# Patient Record
Sex: Female | Born: 1995 | Race: Black or African American | Hispanic: No | Marital: Single | Smoking: Current every day smoker
Health system: Southern US, Community
[De-identification: ages and names within clinical notes are randomized; demographics above are authoritative.]

## PROBLEM LIST (undated history)

## (undated) ENCOUNTER — Inpatient Hospital Stay (HOSPITAL_COMMUNITY): Payer: Medicaid Other

## (undated) ENCOUNTER — Inpatient Hospital Stay (HOSPITAL_COMMUNITY): Payer: Self-pay

## (undated) DIAGNOSIS — L4050 Arthropathic psoriasis, unspecified: Secondary | ICD-10-CM

## (undated) DIAGNOSIS — M1990 Unspecified osteoarthritis, unspecified site: Secondary | ICD-10-CM

## (undated) DIAGNOSIS — G43909 Migraine, unspecified, not intractable, without status migrainosus: Secondary | ICD-10-CM

## (undated) DIAGNOSIS — R51 Headache: Secondary | ICD-10-CM

## (undated) DIAGNOSIS — R519 Headache, unspecified: Secondary | ICD-10-CM

## (undated) DIAGNOSIS — L409 Psoriasis, unspecified: Secondary | ICD-10-CM

## (undated) HISTORY — PX: NO PAST SURGERIES: SHX2092

---
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
## 2015-10-06 NOTE — L&D Delivery Note (Signed)
Patient is 20 y.o. G1P0000 4687w4d admitted for PROM.   Delivery Note At 7:08 PM a viable and healthy female was delivered via Vaginal, Spontaneous Delivery (Presentation: OA ).  APGAR:7 ,8 .  Placenta status: intact.  Cord: 3 vessel  Without complications.  Anesthesia:  epidural Episiotomy: None Lacerations: 1st degree;Perineal; L Labial Suture Repair: 2.0 vicryl Est. Blood Loss (mL): 100  Mom to postpartum.  Baby to Couplet care / Skin to Skin.  Leland HerElsia J Yoo 05/26/2016, 7:34 PM      Upon arrival patient was complete and pushing. She pushed with good maternal effort to deliver a healthy baby girl. Baby delivered without difficulty, was noted to have good tone and place on maternal abdomen for oral suctioning, drying and stimulation. Delayed cord clamping performed. Placenta delivered intact with 3V cord. Vaginal canal and perineum was inspected and repaired; hemostatic. Pitocin was started and uterus massaged until bleeding slowed. Counts of sharps, instruments, and lap pads were all correct.   Leland HerElsia J Yoo, DO PGY-1, [facility] 8/22/20177:34 PM   OB FELLOW DELIVERY ATTESTATION  I was gloved and present for the delivery in its entirety, and I agree with the above resident's note.    Ernestina PennaNicholas Salwa Bai, MD 8:53 PM

## 2015-10-28 ENCOUNTER — Inpatient Hospital Stay (HOSPITAL_COMMUNITY)
Admission: AD | Admit: 2015-10-28 | Discharge: 2015-10-28 | Disposition: A | Discharge status: Home / Self Care | Payer: Medicaid Other | Source: Ambulatory Visit | Attending: Family Medicine | Admitting: Family Medicine

## 2015-10-28 ENCOUNTER — Encounter (HOSPITAL_COMMUNITY): Payer: Self-pay | Admitting: Student

## 2015-10-28 DIAGNOSIS — O26899 Other specified pregnancy related conditions, unspecified trimester: Secondary | ICD-10-CM

## 2015-10-28 DIAGNOSIS — Z3201 Encounter for pregnancy test, result positive: Secondary | ICD-10-CM | POA: Diagnosis not present

## 2015-10-28 DIAGNOSIS — R110 Nausea: Secondary | ICD-10-CM | POA: Diagnosis not present

## 2015-10-28 DIAGNOSIS — N926 Irregular menstruation, unspecified: Secondary | ICD-10-CM | POA: Diagnosis not present

## 2015-10-28 DIAGNOSIS — O26891 Other specified pregnancy related conditions, first trimester: Secondary | ICD-10-CM | POA: Insufficient documentation

## 2015-10-28 DIAGNOSIS — Z3A01 Less than 8 weeks gestation of pregnancy: Secondary | ICD-10-CM | POA: Diagnosis not present

## 2015-10-28 LAB — URINALYSIS, ROUTINE W REFLEX MICROSCOPIC
Bilirubin Urine: NEGATIVE
GLUCOSE, UA: NEGATIVE mg/dL
Hgb urine dipstick: NEGATIVE
KETONES UR: NEGATIVE mg/dL
LEUKOCYTES UA: NEGATIVE
NITRITE: NEGATIVE
PROTEIN: NEGATIVE mg/dL
Specific Gravity, Urine: 1.015 (ref 1.005–1.030)
pH: 7.5 (ref 5.0–8.0)

## 2015-10-28 LAB — POCT PREGNANCY, URINE: PREG TEST UR: POSITIVE — AB

## 2015-10-28 MED ORDER — METOCLOPRAMIDE HCL 10 MG PO TABS: 10.0000 mg | ORAL_TABLET | Freq: Four times a day (QID) | ORAL | Status: DC

## 2015-10-28 MED ORDER — PROMETHAZINE HCL 25 MG PO TABS: 25.0000 mg | ORAL_TABLET | Freq: Four times a day (QID) | ORAL | Status: DC | PRN

## 2015-10-28 NOTE — MAU Provider Note (Signed)
S:  Christine Lambert is a 20 y.o. G1P0 at [redacted]w[redacted]d wks here for confirmation of pregnancy. Patient's last menstrual period was 09/09/2015 (approximate)..  Denies any vaginal bleeding or abdominal pain. Reports daily nausea, requesting medication for nausea. No vomiting or diarrhea. Plans to get prenatal care at undecided.  O:  Past Medical History  Diagnosis Date  . Medical history non-contributory     No family history on file.   Filed Vitals:   10/28/15 1339  BP: 107/58  Pulse: 82  Temp: 98.7 F (37.1 C)  Resp: 16   General:  A&OX3 with no signs of acute distress. She appears well-developed and well-nourished. No distress.  Neck: Normal range of motion.  Pulmonary/Chest: Effort normal. No respiratory distress.  Musculoskeletal: Normal range of motion.  Neurological: She is alert and oriented to person, place, and time.  Skin: Skin is warm and dry.   A: 1. Missed period   2. Positive urine pregnancy test   3. Pregnancy related nausea, antepartum      P: Explained benefits of taking prenatal vitamins w/folic acid early in pregnancy. Begin prenatal care. Reviewed warning signs of pregnancy. Rx phenergan & metoclopramide  Given pregnancy verification letter & list of ob/gyn providers  Judeth Horn, NP

## 2015-10-28 NOTE — MAU Note (Signed)
Took 2 tests last Friday, one was positive, one was negative.  Has been really nauseated

## 2015-10-28 NOTE — Discharge Instructions (Signed)
First Trimester of Pregnancy The first trimester of pregnancy is from week 1 until the end of week 12 (months 1 through 3). A week after a sperm fertilizes an egg, the egg will implant on the wall of the uterus. This embryo will begin to develop into a baby. Genes from you and your partner are forming the baby. The female genes determine whether the baby is a boy or a girl. At 6-8 weeks, the eyes and face are formed, and the heartbeat can be seen on ultrasound. At the end of 12 weeks, all the baby's organs are formed.  Now that you are pregnant, you will want to do everything you can to have a healthy baby. Two of the most important things are to get good prenatal care and to follow your health care provider's instructions. Prenatal care is all the medical care you receive before the baby's birth. This care will help prevent, find, and treat any problems during the pregnancy and childbirth. BODY CHANGES Your body goes through many changes during pregnancy. The changes vary from woman to woman.   You may gain or lose a couple of pounds at first.  You may feel sick to your stomach (nauseous) and throw up (vomit). If the vomiting is uncontrollable, call your health care provider.  You may tire easily.  You may develop headaches that can be relieved by medicines approved by your health care provider.  You may urinate more often. Painful urination may mean you have a bladder infection.  You may develop heartburn as a result of your pregnancy.  You may develop constipation because certain hormones are causing the muscles that push waste through your intestines to slow down.  You may develop hemorrhoids or swollen, bulging veins (varicose veins).  Your breasts may begin to grow larger and become tender. Your nipples may stick out more, and the tissue that surrounds them (areola) may become darker.  Your gums may bleed and may be sensitive to brushing and flossing.  Dark spots or blotches (chloasma,  mask of pregnancy) may develop on your face. This will likely fade after the baby is born.  Your menstrual periods will stop.  You may have a loss of appetite.  You may develop cravings for certain kinds of food.  You may have changes in your emotions from day to day, such as being excited to be pregnant or being concerned that something may go wrong with the pregnancy and baby.  You may have more vivid and strange dreams.  You may have changes in your hair. These can include thickening of your hair, rapid growth, and changes in texture. Some women also have hair loss during or after pregnancy, or hair that feels dry or thin. Your hair will most likely return to normal after your baby is born. WHAT TO EXPECT AT YOUR PRENATAL VISITS During a routine prenatal visit:  You will be weighed to make sure you and the baby are growing normally.  Your blood pressure will be taken.  Your abdomen will be measured to track your baby's growth.  The fetal heartbeat will be listened to starting around week 10 or 12 of your pregnancy.  Test results from any previous visits will be discussed. Your health care provider may ask you:  How you are feeling.  If you are feeling the baby move.  If you have had any abnormal symptoms, such as leaking fluid, bleeding, severe headaches, or abdominal cramping.  If you are using any tobacco products,   including cigarettes, chewing tobacco, and electronic cigarettes.  If you have any questions. Other tests that may be performed during your first trimester include:  Blood tests to find your blood type and to check for the presence of any previous infections. They will also be used to check for low iron levels (anemia) and Rh antibodies. Later in the pregnancy, blood tests for diabetes will be done along with other tests if problems develop.  Urine tests to check for infections, diabetes, or protein in the urine.  An ultrasound to confirm the proper growth  and development of the baby.  An amniocentesis to check for possible genetic problems.  Fetal screens for spina bifida and Down syndrome.  You may need other tests to make sure you and the baby are doing well.  HIV (human immunodeficiency virus) testing. Routine prenatal testing includes screening for HIV, unless you choose not to have this test. HOME CARE INSTRUCTIONS  Medicines  Follow your health care provider's instructions regarding medicine use. Specific medicines may be either safe or unsafe to take during pregnancy.  Take your prenatal vitamins as directed.  If you develop constipation, try taking a stool softener if your health care provider approves. Diet  Eat regular, well-balanced meals. Choose a variety of foods, such as meat or vegetable-based protein, fish, milk and low-fat dairy products, vegetables, fruits, and whole grain breads and cereals. Your health care provider will help you determine the amount of weight gain that is right for you.  Avoid raw meat and uncooked cheese. These carry germs that can cause birth defects in the baby.  Eating four or five small meals rather than three large meals a day may help relieve nausea and vomiting. If you start to feel nauseous, eating a few soda crackers can be helpful. Drinking liquids between meals instead of during meals also seems to help nausea and vomiting.  If you develop constipation, eat more high-fiber foods, such as fresh vegetables or fruit and whole grains. Drink enough fluids to keep your urine clear or pale yellow. Activity and Exercise  Exercise only as directed by your health care provider. Exercising will help you:  Control your weight.  Stay in shape.  Be prepared for labor and delivery.  Experiencing pain or cramping in the lower abdomen or low back is a good sign that you should stop exercising. Check with your health care provider before continuing normal exercises.  Try to avoid standing for long  periods of time. Move your legs often if you must stand in one place for a long time.  Avoid heavy lifting.  Wear low-heeled shoes, and practice good posture.  You may continue to have sex unless your health care provider directs you otherwise. Relief of Pain or Discomfort  Wear a good support bra for breast tenderness.   Take warm sitz baths to soothe any pain or discomfort caused by hemorrhoids. Use hemorrhoid cream if your health care provider approves.   Rest with your legs elevated if you have leg cramps or low back pain.  If you develop varicose veins in your legs, wear support hose. Elevate your feet for 15 minutes, 3-4 times a day. Limit salt in your diet. Prenatal Care  Schedule your prenatal visits by the twelfth week of pregnancy. They are usually scheduled monthly at first, then more often in the last 2 months before delivery.  Write down your questions. Take them to your prenatal visits.  Keep all your prenatal visits as directed by your   health care provider. Safety  Wear your seat belt at all times when driving.  Make a list of emergency phone numbers, including numbers for family, friends, the hospital, and police and fire departments. General Tips  Ask your health care provider for a referral to a local prenatal education class. Begin classes no later than at the beginning of month 6 of your pregnancy.  Ask for help if you have counseling or nutritional needs during pregnancy. Your health care provider can offer advice or refer you to specialists for help with various needs.  Do not use hot tubs, steam rooms, or saunas.  Do not douche or use tampons or scented sanitary pads.  Do not cross your legs for long periods of time.  Avoid cat litter boxes and soil used by cats. These carry germs that can cause birth defects in the baby and possibly loss of the fetus by miscarriage or stillbirth.  Avoid all smoking, herbs, alcohol, and medicines not prescribed by  your health care provider. Chemicals in these affect the formation and growth of the baby.  Do not use any tobacco products, including cigarettes, chewing tobacco, and electronic cigarettes. If you need help quitting, ask your health care provider. You may receive counseling support and other resources to help you quit.  Schedule a dentist appointment. At home, brush your teeth with a soft toothbrush and be gentle when you floss. SEEK MEDICAL CARE IF:   You have dizziness.  You have mild pelvic cramps, pelvic pressure, or nagging pain in the abdominal area.  You have persistent nausea, vomiting, or diarrhea.  You have a bad smelling vaginal discharge.  You have pain with urination.  You notice increased swelling in your face, hands, legs, or ankles. SEEK IMMEDIATE MEDICAL CARE IF:   You have a fever.  You are leaking fluid from your vagina.  You have spotting or bleeding from your vagina.  You have severe abdominal cramping or pain.  You have rapid weight gain or loss.  You vomit blood or material that looks like coffee grounds.  You are exposed to German measles and have never had them.  You are exposed to fifth disease or chickenpox.  You develop a severe headache.  You have shortness of breath.  You have any kind of trauma, such as from a fall or a car accident.   This information is not intended to replace advice given to you by your health care provider. Make sure you discuss any questions you have with your health care provider.   Document Released: 09/15/2001 Document Revised: 10/12/2014 Document Reviewed: 08/01/2013 Elsevier Interactive Patient Education 2016 [employer]     Safe Medications in Pregnancy   Acne: Benzoyl Peroxide Salicylic Acid  Backache/Headache: Tylenol: 2 regular strength every 4 hours OR              2 Extra strength every 6 hours  Colds/Coughs/Allergies: Benadryl (alcohol free) 25 mg every 6 hours as needed Breath right  strips Claritin Cepacol throat lozenges Chloraseptic throat spray Cold-Eeze- up to three times per day Cough drops, alcohol free Flonase (by prescription only) Guaifenesin Mucinex Robitussin DM (plain only, alcohol free) Saline nasal spray/drops Sudafed (pseudoephedrine) & Actifed ** use only after [redacted] weeks gestation and if you do not have high blood pressure Tylenol Vicks Vaporub Zinc lozenges Zyrtec   Constipation: Colace Ducolax suppositories Fleet enema Glycerin suppositories Metamucil Milk of magnesia Miralax Senokot Smooth move tea  Diarrhea: Kaopectate Imodium A-D  *NO pepto Bismol    Hemorrhoids: Anusol Anusol HC Preparation H Tucks  Indigestion: Tums Maalox Mylanta Zantac  Pepcid  Insomnia: Benadryl (alcohol free) 25mg every 6 hours as needed Tylenol PM Unisom, no Gelcaps  Leg Cramps: Tums MagGel  Nausea/Vomiting:  Bonine Dramamine Emetrol Ginger extract Sea bands Meclizine  Nausea medication to take during pregnancy:  Unisom (doxylamine succinate 25 mg tablets) Take one tablet daily at bedtime. If symptoms are not adequately controlled, the dose can be increased to a maximum recommended dose of two tablets daily (1/2 tablet in the morning, 1/2 tablet mid-afternoon and one at bedtime). Vitamin B6 100mg tablets. Take one tablet twice a day (up to 200 mg per day).  Skin Rashes: Aveeno products Benadryl cream or 25mg every 6 hours as needed Calamine Lotion 1% cortisone cream  Yeast infection: Gyne-lotrimin 7 Monistat 7  Gum/tooth pain: Anbesol  **If taking multiple medications, please check labels to avoid duplicating the same active ingredients **take medication as directed on the label ** Do not exceed 4000 mg of tylenol in 24 hours **Do not take medications that contain aspirin or ibuprofen     

## 2015-11-18 ENCOUNTER — Encounter: Payer: Self-pay | Admitting: Obstetrics

## 2015-11-18 ENCOUNTER — Ambulatory Visit (INDEPENDENT_AMBULATORY_CARE_PROVIDER_SITE_OTHER): Payer: Medicaid Other | Admitting: Obstetrics

## 2015-11-18 VITALS — BP 98/62 | HR 76 | Temp 99.3°F | Wt 119.0 lb

## 2015-11-18 DIAGNOSIS — Z3401 Encounter for supervision of normal first pregnancy, first trimester: Secondary | ICD-10-CM

## 2015-11-18 LAB — POCT URINALYSIS DIPSTICK
Bilirubin, UA: NEGATIVE
Glucose, UA: NEGATIVE
KETONES UA: NEGATIVE
Leukocytes, UA: NEGATIVE
Nitrite, UA: NEGATIVE
PH UA: 8
PROTEIN UA: NEGATIVE
RBC UA: NEGATIVE
SPEC GRAV UA: 1.01
Urobilinogen, UA: NEGATIVE

## 2015-11-18 MED ORDER — VITAFOL GUMMIES 3.33-0.333-34.8 MG PO CHEW: 1.0000 | CHEWABLE_TABLET | Freq: Every day | ORAL | Status: DC

## 2015-11-18 NOTE — Progress Notes (Signed)
Subjective:    KINSLY HILD is being seen today for her first obstetrical visit.  This is not a planned pregnancy. She is at [redacted]w[redacted]d gestation. Her obstetrical history is significant for adolescent. Relationship with FOB: significant other, not living together. Patient does intend to breast feed. Pregnancy history fully reviewed.  The information documented in the HPI was reviewed and verified.  Menstrual History: OB History    Gravida Para Term Preterm AB TAB SAB Ectopic Multiple Living   1                Patient's last menstrual period was 09/09/2015 (approximate).    Past Medical History  Diagnosis Date  . Medical history non-contributory     Past Surgical History  Procedure Laterality Date  . No past surgeries       (Not in a hospital admission) No Known Allergies  Social History  Substance Use Topics  . Smoking status: Former Smoker    Quit date: 08/18/2015  . Smokeless tobacco: Never Used  . Alcohol Use: No    History reviewed. No pertinent family history.   Review of Systems Constitutional: negative for weight loss Gastrointestinal: negative for vomiting Genitourinary:negative for genital lesions and vaginal discharge and dysuria Musculoskeletal:negative for back pain Behavioral/Psych: negative for abusive relationship, depression, illegal drug usage and tobacco use    Objective:    BP 98/62 mmHg  Pulse 76  Temp(Src) 99.3 F (37.4 C)  Wt 119 lb (53.978 kg)  LMP 09/09/2015 (Approximate) General Appearance:    Alert, cooperative, no distress, appears stated age  Head:    Normocephalic, without obvious abnormality, atraumatic  Eyes:    PERRL, conjunctiva/corneas clear, EOM's intact, fundi    benign, both eyes  Ears:    Normal TM's and external ear canals, both ears  Nose:   Nares normal, septum midline, mucosa normal, no drainage    or sinus tenderness  Throat:   Lips, mucosa, and tongue normal; teeth and gums normal  Neck:   Supple, symmetrical,  trachea midline, no adenopathy;    thyroid:  no enlargement/tenderness/nodules; no carotid   bruit or JVD  Back:     Symmetric, no curvature, ROM normal, no CVA tenderness  Lungs:     Clear to auscultation bilaterally, respirations unlabored  Chest Wall:    No tenderness or deformity   Heart:    Regular rate and rhythm, S1 and S2 normal, no murmur, rub   or gallop  Breast Exam:    No tenderness, masses, or nipple abnormality  Abdomen:     Soft, non-tender, bowel sounds active all four quadrants,    no masses, no organomegaly  Genitalia:    Normal female without lesion, discharge or tenderness  Extremities:   Extremities normal, atraumatic, no cyanosis or edema  Pulses:   2+ and symmetric all extremities  Skin:   Skin color, texture, turgor normal, no rashes or lesions  Lymph nodes:   Cervical, supraclavicular, and axillary nodes normal  Neurologic:   CNII-XII intact, normal strength, sensation and reflexes    throughout      Lab Review Urine pregnancy test Labs reviewed yes Radiologic studies reviewed yes Assessment:    Pregnancy at [redacted]w[redacted]d weeks    Plan:      Prenatal vitamins.  Counseling provided regarding continued use of seat belts, cessation of alcohol consumption, smoking or use of illicit drugs; infection precautions i.e., influenza/TDAP immunizations, toxoplasmosis,CMV, parvovirus, listeria and varicella; workplace safety, exercise during pregnancy; routine dental care,  safe medications, sexual activity, hot tubs, saunas, pools, travel, caffeine use, fish and methlymercury, potential toxins, hair treatments, varicose veins Weight gain recommendations per IOM guidelines reviewed: underweight/BMI< 18.5--> gain 28 - 40 lbs; normal weight/BMI 18.5 - 24.9--> gain 25 - 35 lbs; overweight/BMI 25 - 29.9--> gain 15 - 25 lbs; obese/BMI >30->gain  11 - 20 lbs Problem list reviewed and updated. FIRST/CF mutation testing/NIPT/QUAD SCREEN/fragile X/Ashkenazi Jewish population  testing/Spinal muscular atrophy discussed: requested. Role of ultrasound in pregnancy discussed; fetal survey: requested. Amniocentesis discussed: not indicated. VBAC calculator score: VBAC consent form provided Meds ordered this encounter  Medications  . Prenatal Multivit-Min-Fe-FA (PRENATAL #2 PO)    Sig: Take by mouth.   Orders Placed This Encounter  Procedures  . SureSwab, Vaginosis/Vaginitis Plus  . Culture, OB Urine  . Obstetric panel  . HIV antibody  . Hemoglobinopathy evaluation  . Varicella zoster antibody, IgG  . VITAMIN D 25 Hydroxy (Vit-D Deficiency, Fractures)  . POCT urinalysis dipstick    Follow up in 4 weeks.

## 2015-11-19 ENCOUNTER — Encounter: Payer: Self-pay | Admitting: Obstetrics

## 2015-11-19 LAB — OBSTETRIC PANEL
Antibody Screen: NEGATIVE
BASOS ABS: 0 10*3/uL (ref 0.0–0.1)
Basophils Relative: 0 % (ref 0–1)
Eosinophils Absolute: 0.3 10*3/uL (ref 0.0–0.7)
Eosinophils Relative: 3 % (ref 0–5)
HCT: 34.9 % — ABNORMAL LOW (ref 36.0–46.0)
HEP B S AG: NEGATIVE
Hemoglobin: 11.4 g/dL — ABNORMAL LOW (ref 12.0–15.0)
LYMPHS ABS: 2.1 10*3/uL (ref 0.7–4.0)
LYMPHS PCT: 25 % (ref 12–46)
MCH: 29.5 pg (ref 26.0–34.0)
MCHC: 32.7 g/dL (ref 30.0–36.0)
MCV: 90.4 fL (ref 78.0–100.0)
MONOS PCT: 7 % (ref 3–12)
MPV: 10.1 fL (ref 8.6–12.4)
Monocytes Absolute: 0.6 10*3/uL (ref 0.1–1.0)
NEUTROS ABS: 5.5 10*3/uL (ref 1.7–7.7)
NEUTROS PCT: 65 % (ref 43–77)
Platelets: 338 10*3/uL (ref 150–400)
RBC: 3.86 MIL/uL — ABNORMAL LOW (ref 3.87–5.11)
RDW: 14.3 % (ref 11.5–15.5)
RUBELLA: 1.59 {index} — AB (ref <0.90)
Rh Type: POSITIVE
WBC: 8.4 10*3/uL (ref 4.0–10.5)

## 2015-11-19 LAB — VITAMIN D 25 HYDROXY (VIT D DEFICIENCY, FRACTURES): VIT D 25 HYDROXY: 19 ng/mL — AB (ref 30–100)

## 2015-11-19 LAB — CULTURE, OB URINE
COLONY COUNT: NO GROWTH
ORGANISM ID, BACTERIA: NO GROWTH

## 2015-11-19 LAB — VARICELLA ZOSTER ANTIBODY, IGG: Varicella IgG: 307.4 Index — ABNORMAL HIGH (ref <135.00)

## 2015-11-19 LAB — HIV ANTIBODY (ROUTINE TESTING W REFLEX): HIV: NONREACTIVE

## 2015-11-20 ENCOUNTER — Ambulatory Visit (INDEPENDENT_AMBULATORY_CARE_PROVIDER_SITE_OTHER): Payer: Medicaid Other

## 2015-11-20 ENCOUNTER — Other Ambulatory Visit: Payer: Medicaid Other

## 2015-11-20 ENCOUNTER — Telehealth: Payer: Self-pay | Admitting: *Deleted

## 2015-11-20 DIAGNOSIS — Z3401 Encounter for supervision of normal first pregnancy, first trimester: Secondary | ICD-10-CM

## 2015-11-20 DIAGNOSIS — O3680X1 Pregnancy with inconclusive fetal viability, fetus 1: Secondary | ICD-10-CM | POA: Diagnosis not present

## 2015-11-20 LAB — HEMOGLOBINOPATHY EVALUATION
HGB A: 97 % (ref 96.8–97.8)
HGB S QUANTITAION: 0 %
Hemoglobin Other: 0 %
Hgb A2 Quant: 3 % (ref 2.2–3.2)
Hgb F Quant: 0 % (ref 0.0–2.0)

## 2015-11-20 NOTE — Telephone Encounter (Signed)
[facility] contacted the office for verification of prenatal vitamin prescription. Verified that it should be 3 gummies daily instead of the one daily that was written.

## 2015-11-22 ENCOUNTER — Other Ambulatory Visit: Payer: Self-pay | Admitting: Certified Nurse Midwife

## 2015-11-22 ENCOUNTER — Other Ambulatory Visit: Payer: Self-pay | Admitting: Obstetrics

## 2015-11-22 DIAGNOSIS — B9689 Other specified bacterial agents as the cause of diseases classified elsewhere: Secondary | ICD-10-CM

## 2015-11-22 DIAGNOSIS — N760 Acute vaginitis: Secondary | ICD-10-CM

## 2015-11-22 DIAGNOSIS — A749 Chlamydial infection, unspecified: Secondary | ICD-10-CM

## 2015-11-22 DIAGNOSIS — B379 Candidiasis, unspecified: Secondary | ICD-10-CM

## 2015-11-22 LAB — SURESWAB, VAGINOSIS/VAGINITIS PLUS
Atopobium vaginae: 7.2 Log (cells/mL)
C. ALBICANS, DNA: DETECTED — AB
C. GLABRATA, DNA: NOT DETECTED
C. PARAPSILOSIS, DNA: NOT DETECTED
C. TRACHOMATIS RNA, TMA: DETECTED — AB
C. TROPICALIS, DNA: NOT DETECTED
Gardnerella vaginalis: >8 Log (cells/mL)
LACTOBACILLUS SPECIES: NOT DETECTED Log (cells/mL)
MEGASPHAERA SPECIES: >8 Log (cells/mL)
N. gonorrhoeae RNA, TMA: NOT DETECTED
T. VAGINALIS RNA, QL TMA: NOT DETECTED

## 2015-11-22 MED ORDER — TERCONAZOLE 0.4 % VA CREA: 1.0000 | TOPICAL_CREAM | Freq: Every day | VAGINAL | Status: DC

## 2015-11-22 MED ORDER — CEFIXIME 400 MG PO TABS: 400.0000 mg | ORAL_TABLET | Freq: Every day | ORAL | Status: DC

## 2015-11-22 MED ORDER — AZITHROMYCIN 250 MG PO TABS: 1000.0000 mg | ORAL_TABLET | Freq: Once | ORAL | Status: DC

## 2015-11-22 MED ORDER — TINIDAZOLE 500 MG PO TABS: 1000.0000 mg | ORAL_TABLET | Freq: Every day | ORAL | Status: DC

## 2015-12-16 ENCOUNTER — Other Ambulatory Visit: Payer: Self-pay | Admitting: Obstetrics

## 2015-12-16 ENCOUNTER — Encounter: Payer: Self-pay | Admitting: Obstetrics

## 2015-12-16 ENCOUNTER — Ambulatory Visit (INDEPENDENT_AMBULATORY_CARE_PROVIDER_SITE_OTHER): Payer: Medicaid Other | Admitting: Obstetrics

## 2015-12-16 DIAGNOSIS — Z3402 Encounter for supervision of normal first pregnancy, second trimester: Secondary | ICD-10-CM

## 2015-12-16 LAB — POCT URINALYSIS DIPSTICK
Bilirubin, UA: NEGATIVE
GLUCOSE UA: NEGATIVE
Ketones, UA: NEGATIVE
LEUKOCYTES UA: NEGATIVE
NITRITE UA: NEGATIVE
Protein, UA: NEGATIVE
RBC UA: NEGATIVE
Spec Grav, UA: 1.02
UROBILINOGEN UA: NEGATIVE
pH, UA: 5

## 2015-12-17 ENCOUNTER — Encounter: Payer: Self-pay | Admitting: Obstetrics

## 2015-12-17 NOTE — Progress Notes (Signed)
  Subjective:    Christine Lambert is a 20 y.o. female being seen today for her obstetrical visit. She is at 5669w4d gestation. Patient reports: no complaints.  Problem List Items Addressed This Visit    None    Visit Diagnoses    Encounter for supervision of normal first pregnancy in second trimester    -  Primary    Relevant Orders    POCT urinalysis dipstick (Completed)    AFP, Quad Screen    US OB Comp + 14 Wk    US OB Transvaginal      There are no active problems to display for this patient.   Objective:     BP 97/65 mmHg  Pulse 81  Temp(Src) 98.3 F (36.8 C)  Wt 122 lb (55.339 kg)  LMP 09/09/2015 (Approximate) Uterine Size: Below umbilicus     Assessment:    Pregnancy @ 2369w4d  weeks Doing well    Plan:    Problem list reviewed and updated. Labs reviewed.  Follow up in 4 weeks. FIRST/CF mutation testing/NIPT/QUAD SCREEN/fragile X/Ashkenazi Jewish population testing/Spinal muscular atrophy discussed: requested. Role of ultrasound in pregnancy discussed; fetal survey: requested. Amniocentesis discussed: not indicated.

## 2015-12-18 LAB — AFP, QUAD SCREEN
DIA MOM VALUE: 0.63
DIA Value (EIA): 127.82 pg/mL
DSR (BY AGE) 1 IN: 1169
DSR (Second Trimester) 1 IN: 10000
Gestational Age: 16.4 WEEKS
MSAFP Mom: 1.17
MSAFP: 51.4 ng/mL
MSHCG MOM: 0.5
MSHCG: 21909 m[IU]/mL
Maternal Age At EDD: 19.6 YEARS
Osb Risk: 10000
PDF: 0
T18 (By Age): 1:4553 {titer}
TEST RESULTS AFP: NEGATIVE
WEIGHT: 122 [lb_av]
uE3 Mom: 0.85
uE3 Value: 0.83 ng/mL

## 2016-01-09 ENCOUNTER — Ambulatory Visit (INDEPENDENT_AMBULATORY_CARE_PROVIDER_SITE_OTHER): Payer: Medicaid Other

## 2016-01-09 ENCOUNTER — Encounter: Payer: Self-pay | Admitting: Obstetrics

## 2016-01-09 ENCOUNTER — Ambulatory Visit (INDEPENDENT_AMBULATORY_CARE_PROVIDER_SITE_OTHER): Payer: Medicaid Other | Admitting: Obstetrics

## 2016-01-09 VITALS — BP 90/60 | HR 72 | Temp 98.6°F | Wt 128.0 lb

## 2016-01-09 DIAGNOSIS — Z3402 Encounter for supervision of normal first pregnancy, second trimester: Secondary | ICD-10-CM

## 2016-01-09 DIAGNOSIS — Z36 Encounter for antenatal screening of mother: Secondary | ICD-10-CM | POA: Diagnosis not present

## 2016-01-09 DIAGNOSIS — G43009 Migraine without aura, not intractable, without status migrainosus: Secondary | ICD-10-CM

## 2016-01-09 DIAGNOSIS — J301 Allergic rhinitis due to pollen: Secondary | ICD-10-CM

## 2016-01-09 LAB — POCT URINALYSIS DIPSTICK
Bilirubin, UA: NEGATIVE
Blood, UA: NEGATIVE
Glucose, UA: NEGATIVE
KETONES UA: NEGATIVE
Leukocytes, UA: NEGATIVE
Nitrite, UA: NEGATIVE
PROTEIN UA: NEGATIVE
SPEC GRAV UA: 1.015
Urobilinogen, UA: NEGATIVE
pH, UA: 8

## 2016-01-09 NOTE — Progress Notes (Signed)
Patient is having migraines that she says is really bad. Feeling pressure but no pain

## 2016-01-09 NOTE — Progress Notes (Signed)
Subjective:    Christine Lambert is a 20 y.o. female being seen today for her obstetrical visit. She is at 925w6d gestation. Patient reports: headache and seasonal rhinitis . Fetal movement: normal.  Problem List Items Addressed This Visit    None    Visit Diagnoses    Encounter for supervision of normal first pregnancy in second trimester    -  Primary    Relevant Orders    POCT urinalysis dipstick (Completed)      There are no active problems to display for this patient.  Objective:    BP 90/60 mmHg  Pulse 72  Temp(Src) 98.6 F (37 C)  Wt 128 lb (58.06 kg)  LMP 09/09/2015 (Approximate) FHT: 150 BPM  Uterine Size: size equals dates     Assessment:    Pregnancy @ 3625w6d     Headache  Seasonal allergies  Plan:   Tylenol recommended  Continue Zyrtec   Signs and symptoms of preterm labor: discussed.  Labs, problem list reviewed and updated 2 hr GTT planned Follow up in 4 weeks.

## 2016-01-13 ENCOUNTER — Encounter: Payer: Medicaid Other | Admitting: Obstetrics

## 2016-01-21 ENCOUNTER — Other Ambulatory Visit: Payer: Self-pay | Admitting: Certified Nurse Midwife

## 2016-02-03 ENCOUNTER — Ambulatory Visit (INDEPENDENT_AMBULATORY_CARE_PROVIDER_SITE_OTHER): Payer: Medicaid Other | Admitting: Obstetrics

## 2016-02-03 VITALS — BP 104/64 | HR 85 | Temp 98.3°F | Wt 140.0 lb

## 2016-02-03 DIAGNOSIS — J301 Allergic rhinitis due to pollen: Secondary | ICD-10-CM

## 2016-02-03 DIAGNOSIS — Z3402 Encounter for supervision of normal first pregnancy, second trimester: Secondary | ICD-10-CM

## 2016-02-03 MED ORDER — CETIRIZINE HCL 10 MG PO CAPS: 1.0000 | ORAL_CAPSULE | Freq: Every day | ORAL | Status: DC

## 2016-02-03 NOTE — Progress Notes (Signed)
Patient has no questions or concerns today.

## 2016-02-10 ENCOUNTER — Encounter: Payer: Medicaid Other | Admitting: Obstetrics

## 2016-02-10 ENCOUNTER — Encounter: Payer: Self-pay | Admitting: Obstetrics

## 2016-02-10 NOTE — Progress Notes (Signed)
Subjective:    Christine Lambert is a 20 y.o. female being seen today for her obstetrical visit. She is at 5476w3d gestation. Patient reports: no complaints . Fetal movement: normal.  Problem List Items Addressed This Visit    None    Visit Diagnoses    Rhinitis due to pollen    -  Primary    Relevant Medications    Cetirizine HCl (ZYRTEC ALLERGY) 10 MG CAPS      There are no active problems to display for this patient.  Objective:    BP 104/64 mmHg  Pulse 85  Temp(Src) 98.3 F (36.8 C)  Wt 140 lb (63.504 kg)  LMP 09/09/2015 (Approximate) FHT: 150 BPM  Uterine Size: size equals dates     Assessment:    Pregnancy @ 7976w3d    Plan:    OBGCT: ordered for next visit. Signs and symptoms of preterm labor: discussed.  Labs, problem list reviewed and updated 2 hr GTT planned Follow up in 4 weeks.

## 2016-03-03 ENCOUNTER — Encounter: Payer: Self-pay | Admitting: Obstetrics

## 2016-03-03 ENCOUNTER — Ambulatory Visit (INDEPENDENT_AMBULATORY_CARE_PROVIDER_SITE_OTHER): Payer: Medicaid Other | Admitting: Obstetrics

## 2016-03-03 ENCOUNTER — Other Ambulatory Visit: Payer: Self-pay | Admitting: Obstetrics

## 2016-03-03 ENCOUNTER — Other Ambulatory Visit: Payer: Medicaid Other

## 2016-03-03 VITALS — BP 94/63 | HR 81 | Temp 98.6°F | Wt 150.0 lb

## 2016-03-03 DIAGNOSIS — Z3402 Encounter for supervision of normal first pregnancy, second trimester: Secondary | ICD-10-CM

## 2016-03-03 DIAGNOSIS — O283 Abnormal ultrasonic finding on antenatal screening of mother: Secondary | ICD-10-CM

## 2016-03-03 LAB — POCT URINALYSIS DIPSTICK
Bilirubin, UA: NEGATIVE
Blood, UA: NEGATIVE
Glucose, UA: NEGATIVE
Ketones, UA: NEGATIVE
LEUKOCYTES UA: NEGATIVE
NITRITE UA: NEGATIVE
PROTEIN UA: NEGATIVE
UROBILINOGEN UA: NEGATIVE
pH, UA: 7.5

## 2016-03-03 NOTE — Progress Notes (Signed)
Subjective:    Christine Lambert is a 20 y.o. female being seen today for her obstetrical visit. She is at 2615w4d gestation. Patient reports: no complaints . Fetal movement: normal.  Problem List Items Addressed This Visit    None    Visit Diagnoses    Encounter for supervision of normal first pregnancy in second trimester    -  Primary    Relevant Orders    POCT urinalysis dipstick (Completed)    Glucose Tolerance, 2 Hours w/1 Hour    CBC    HIV antibody    RPR      There are no active problems to display for this patient.  Objective:    BP 94/63 mmHg  Pulse 81  Temp(Src) 98.6 F (37 C)  Wt 150 lb (68.04 kg)  LMP 09/09/2015 (Approximate) FHT: 150 BPM  Uterine Size: size equals dates     Assessment:    Pregnancy @ 4415w4d    Plan:    OBGCT: ordered.  Labs, problem list reviewed and updated 2 hr GTT planned Follow up in 2 weeks.

## 2016-03-03 NOTE — Progress Notes (Signed)
Patient states she is having irregular contractions- usually at work. Contraction are not painful at this point. Patient does have edema at work.

## 2016-03-04 LAB — CBC
Hematocrit: 35.5 % (ref 34.0–46.6)
Hemoglobin: 11.5 g/dL (ref 11.1–15.9)
MCH: 29.9 pg (ref 26.6–33.0)
MCHC: 32.4 g/dL (ref 31.5–35.7)
MCV: 92 fL (ref 79–97)
PLATELETS: 317 10*3/uL (ref 150–379)
RBC: 3.85 x10E6/uL (ref 3.77–5.28)
RDW: 14.2 % (ref 12.3–15.4)
WBC: 10.9 10*3/uL — AB (ref 3.4–10.8)

## 2016-03-04 LAB — HIV ANTIBODY (ROUTINE TESTING W REFLEX): HIV SCREEN 4TH GENERATION: NONREACTIVE

## 2016-03-04 LAB — GLUCOSE TOLERANCE, 2 HOURS W/ 1HR
GLUCOSE, 1 HOUR: 82 mg/dL (ref 65–179)
GLUCOSE, 2 HOUR: 78 mg/dL (ref 65–152)
GLUCOSE, FASTING: 69 mg/dL (ref 65–91)

## 2016-03-04 LAB — RPR: RPR: NONREACTIVE

## 2016-03-10 ENCOUNTER — Other Ambulatory Visit: Payer: Self-pay | Admitting: Certified Nurse Midwife

## 2016-03-17 ENCOUNTER — Encounter (HOSPITAL_COMMUNITY): Payer: Self-pay

## 2016-03-17 ENCOUNTER — Ambulatory Visit (HOSPITAL_COMMUNITY)
Admission: RE | Admit: 2016-03-17 | Discharge: 2016-03-17 | Disposition: A | Discharge status: Home / Self Care | Payer: Medicaid Other | Source: Ambulatory Visit | Attending: Obstetrics | Admitting: Obstetrics

## 2016-03-17 ENCOUNTER — Other Ambulatory Visit: Payer: Self-pay | Admitting: Obstetrics

## 2016-03-17 ENCOUNTER — Ambulatory Visit (INDEPENDENT_AMBULATORY_CARE_PROVIDER_SITE_OTHER): Payer: Medicaid Other | Admitting: Certified Nurse Midwife

## 2016-03-17 ENCOUNTER — Encounter: Payer: Self-pay | Admitting: *Deleted

## 2016-03-17 VITALS — BP 90/64 | HR 88 | Wt 152.0 lb

## 2016-03-17 DIAGNOSIS — Z1389 Encounter for screening for other disorder: Secondary | ICD-10-CM

## 2016-03-17 DIAGNOSIS — Z3A29 29 weeks gestation of pregnancy: Secondary | ICD-10-CM

## 2016-03-17 DIAGNOSIS — Z3403 Encounter for supervision of normal first pregnancy, third trimester: Secondary | ICD-10-CM

## 2016-03-17 DIAGNOSIS — O359XX0 Maternal care for (suspected) fetal abnormality and damage, unspecified, not applicable or unspecified: Secondary | ICD-10-CM

## 2016-03-17 DIAGNOSIS — O283 Abnormal ultrasonic finding on antenatal screening of mother: Secondary | ICD-10-CM | POA: Diagnosis not present

## 2016-03-17 LAB — POCT URINALYSIS DIPSTICK
BILIRUBIN UA: NEGATIVE
Blood, UA: NEGATIVE
Glucose, UA: NEGATIVE
Ketones, UA: NEGATIVE
NITRITE UA: NEGATIVE
PH UA: 8
Spec Grav, UA: <=1.005
Urobilinogen, UA: NEGATIVE

## 2016-03-17 NOTE — Progress Notes (Signed)
Subjective:    Christine Lambert is a 20 y.o. female being seen today for her obstetrical visit. She is at 7134w4d gestation. Patient reports no complaints. Fetal movement: normal.  Problem List Items Addressed This Visit    None    Visit Diagnoses    Encounter for supervision of normal first pregnancy in third trimester    -  Primary    Relevant Orders    POCT urinalysis dipstick (Completed)      There are no active problems to display for this patient.  Objective:    BP 90/64 mmHg  Pulse 88  Wt 152 lb (68.947 kg)  LMP 09/09/2015 (Approximate) FHT:  140 BPM  Uterine Size: size equals dates  Presentation: cephalic     Assessment:    Pregnancy @ 6334w4d weeks    Plan:     labs reviewed, problem list updated Consent signed. GBS planning TDAP offered  Rhogam given for RH negative Pediatrician: discussed. Infant feeding: plans to breastfeed. Maternity leave: discussed. Cigarette smoking: never smoked. Orders Placed This Encounter  Procedures  . POCT urinalysis dipstick   No orders of the defined types were placed in this encounter.   Follow up in 2 Weeks.

## 2016-03-18 ENCOUNTER — Other Ambulatory Visit (HOSPITAL_COMMUNITY): Payer: Medicaid Other

## 2016-03-31 ENCOUNTER — Ambulatory Visit (INDEPENDENT_AMBULATORY_CARE_PROVIDER_SITE_OTHER): Payer: Medicaid Other | Admitting: Certified Nurse Midwife

## 2016-03-31 VITALS — BP 99/63 | HR 83 | Temp 98.4°F | Wt 156.0 lb

## 2016-03-31 DIAGNOSIS — Z3403 Encounter for supervision of normal first pregnancy, third trimester: Secondary | ICD-10-CM

## 2016-03-31 LAB — POCT URINALYSIS DIPSTICK
BILIRUBIN UA: NEGATIVE
Glucose, UA: 100
KETONES UA: NEGATIVE
LEUKOCYTES UA: NEGATIVE
Nitrite, UA: NEGATIVE
PH UA: 6.5
PROTEIN UA: NEGATIVE
RBC UA: NEGATIVE
SPEC GRAV UA: 1.02
Urobilinogen, UA: NEGATIVE

## 2016-03-31 NOTE — Progress Notes (Signed)
Subjective:    Christine Lambert is a 20 y.o. female being seen today for her obstetrical visit. She is at 8055w4d gestation. Patient reports no bleeding, no contractions, no cramping, no leaking and occasional backache. Fetal movement: normal.  Problem List Items Addressed This Visit    None    Visit Diagnoses    Encounter for supervision of normal first pregnancy in third trimester    -  Primary    Relevant Orders    POCT urinalysis dipstick (Completed)      There are no active problems to display for this patient.  Objective:    BP 99/63 mmHg  Pulse 83  Temp(Src) 98.4 F (36.9 C)  Wt 156 lb (70.761 kg)  LMP 09/09/2015 (Approximate) FHT:  148 BPM  Uterine Size: size equals dates  Presentation: cephalic and breech     Assessment:    Pregnancy @ 7555w4d weeks   Plan:     labs reviewed, problem list updated Consent signed. GBS sent TDAP offered  Rhogam given for RH negative Pediatrician: discussed. Maternity leave: discussed.  Orders Placed This Encounter  Procedures  . POCT urinalysis dipstick   No orders of the defined types were placed in this encounter.   Follow up in 2 Weeks.

## 2016-03-31 NOTE — Progress Notes (Signed)
I agree with note by NP Student Andrew Brake.  Was present for exam.  R.Meggin Ola CNM 

## 2016-04-01 ENCOUNTER — Other Ambulatory Visit: Payer: Self-pay | Admitting: Obstetrics

## 2016-04-14 ENCOUNTER — Ambulatory Visit (INDEPENDENT_AMBULATORY_CARE_PROVIDER_SITE_OTHER): Payer: Medicaid Other | Admitting: Certified Nurse Midwife

## 2016-04-14 VITALS — BP 97/66 | HR 86 | Temp 97.7°F | Wt 160.0 lb

## 2016-04-14 DIAGNOSIS — Z3403 Encounter for supervision of normal first pregnancy, third trimester: Secondary | ICD-10-CM

## 2016-04-14 LAB — POCT URINALYSIS DIPSTICK
Bilirubin, UA: NEGATIVE
Blood, UA: NEGATIVE
Glucose, UA: NEGATIVE
Ketones, UA: NEGATIVE
NITRITE UA: NEGATIVE
PH UA: 6
PROTEIN UA: NEGATIVE
Spec Grav, UA: 1.02
Urobilinogen, UA: NEGATIVE

## 2016-04-14 NOTE — Progress Notes (Signed)
I agree with note by NP Student Andrew Brake.  Was present for exam.  R.Huma Imhoff CNM 

## 2016-04-14 NOTE — Progress Notes (Signed)
Subjective:    Christine Lambert is a 20 y.o. female being seen today for her obstetrical visit. She is at 2721w4d gestation. Patient reports fatigue. Fetal movement: normal.  Problem List Items Addressed This Visit    None    Visit Diagnoses    Encounter for supervision of normal first pregnancy in third trimester    -  Primary    Relevant Orders    POCT urinalysis dipstick (Completed)      There are no active problems to display for this patient.  Objective:    BP 97/66 mmHg  Pulse 86  Temp(Src) 97.7 F (36.5 C)  Wt 160 lb (72.576 kg)  LMP 09/09/2015 (Approximate) FHT:  150 BPM  Uterine Size: size equals dates  Presentation: cephalic     Assessment:    Pregnancy @ 3521w4d weeks   Plan:     labs reviewed, problem list updated Consent signed. GBS sent TDAP offered  Rhogam given for RH negative Pediatrician: discussed. Infant feeding: plans to breastfeed. Maternity leave: discussed. Orders Placed This Encounter  Procedures  . POCT urinalysis dipstick   No orders of the defined types were placed in this encounter.   Follow up in 2 Weeks with GBS.

## 2016-04-28 ENCOUNTER — Ambulatory Visit (INDEPENDENT_AMBULATORY_CARE_PROVIDER_SITE_OTHER): Payer: Medicaid Other | Admitting: Certified Nurse Midwife

## 2016-04-28 VITALS — BP 111/74 | HR 92 | Temp 98.3°F | Wt 167.6 lb

## 2016-04-28 DIAGNOSIS — Z3403 Encounter for supervision of normal first pregnancy, third trimester: Secondary | ICD-10-CM | POA: Insufficient documentation

## 2016-04-28 LAB — POCT URINALYSIS DIPSTICK
Bilirubin, UA: NEGATIVE
GLUCOSE UA: NEGATIVE
Ketones, UA: NEGATIVE
Leukocytes, UA: NEGATIVE
Nitrite, UA: NEGATIVE
PH UA: 6
PROTEIN UA: NEGATIVE
RBC UA: NEGATIVE
SPEC GRAV UA: 1.01
UROBILINOGEN UA: NEGATIVE

## 2016-04-28 LAB — OB RESULTS CONSOLE GC/CHLAMYDIA
CHLAMYDIA, DNA PROBE: NEGATIVE
Gonorrhea: NEGATIVE

## 2016-04-28 LAB — OB RESULTS CONSOLE GBS: STREP GROUP B AG: NEGATIVE

## 2016-04-28 NOTE — Progress Notes (Signed)
Subjective:    Christine Lambert is a 20 y.o. female being seen today for her obstetrical visit. She is at [redacted]w[redacted]d gestation. Patient reports fatigue, no bleeding, no cramping, no leaking and occasional contractions.  Patient reports craving ice for the past couple of weeks Fetal movement: normal.  Problem List Items Addressed This Visit      Other   Supervision of normal first pregnancy in third trimester    Other Visit Diagnoses    Encounter for supervision of normal first pregnancy in third trimester    -  Primary   Relevant Orders   POCT urinalysis dipstick (Completed)   Strep Gp B NAA   NuSwab VG+, Candida 6sp     Patient Active Problem List   Diagnosis Date Noted  . Supervision of normal first pregnancy in third trimester 04/28/2016   Objective:    BP 111/74   Pulse 92   Temp 98.3 F (36.8 C)   Wt 167 lb 9.6 oz (76 kg)   LMP 09/09/2015 (Approximate) Comment: has been irreg since Depo  BMI 30.65 kg/m  FHT:  140 BPM  Uterine Size: size equals dates  Presentation: cephalic     Assessment:    Pregnancy @ [redacted]w[redacted]d weeks   Plan:     labs reviewed, problem list updated Consent signed. GBS sent today. TDAP offered  Rhogam given for RH negative Pediatrician: discussed. Infant feeding: plans to breastfeed. Maternity leave: discussed. Cigarette smoking: quit January 2017. Orders Placed This Encounter  Procedures  . Strep Gp B NAA  . POCT urinalysis dipstick   No orders of the defined types were placed in this encounter. Plans for Depo after delivery. Still deciding on pediatrician. Follow up in 1 Week.

## 2016-04-28 NOTE — Assessment & Plan Note (Signed)
  Clinic  [facility] Prenatal Labs  Dating   Blood type: B/POS/-- (02/13 1408)   Genetic Screen 1 Screen:    AFP:     Quad:     NIPS: Antibody:NEG (02/13 1408)  Anatomic [place]  Rubella: 1.59 (02/13 1408)  GTT Early:               Third trimester:  RPR: Non Reactive (05/30 1130)   Flu vaccine  HBsAg: NEGATIVE (02/13 1408)   TDaP vaccine                                               Rhogam: HIV: Non Reactive (05/30 1130)   Baby Food          Breast                                     GBS: (For PCN allergy, check sensitivities)  Contraception  Depo Pap:  Circumcision N/A   Pediatrician    Support Person

## 2016-04-28 NOTE — Progress Notes (Signed)
Pt is having increase in pelvic pressure, LE swelling and spider viens.

## 2016-04-30 LAB — STREP GP B NAA: Strep Gp B NAA: NEGATIVE

## 2016-05-02 LAB — NUSWAB VG+, CANDIDA 6SP
CANDIDA ALBICANS, NAA: NEGATIVE
CANDIDA GLABRATA, NAA: NEGATIVE
CANDIDA KRUSEI, NAA: NEGATIVE
Candida lusitaniae, NAA: NEGATIVE
Candida parapsilosis, NAA: NEGATIVE
Candida tropicalis, NAA: NEGATIVE
Chlamydia trachomatis, NAA: NEGATIVE
NEISSERIA GONORRHOEAE, NAA: NEGATIVE
TRICH VAG BY NAA: NEGATIVE

## 2016-05-05 ENCOUNTER — Ambulatory Visit (INDEPENDENT_AMBULATORY_CARE_PROVIDER_SITE_OTHER): Payer: Medicaid Other | Admitting: Certified Nurse Midwife

## 2016-05-05 VITALS — BP 106/70 | HR 98 | Temp 99.0°F | Wt 170.0 lb

## 2016-05-05 DIAGNOSIS — Z3403 Encounter for supervision of normal first pregnancy, third trimester: Secondary | ICD-10-CM

## 2016-05-05 DIAGNOSIS — Z3493 Encounter for supervision of normal pregnancy, unspecified, third trimester: Secondary | ICD-10-CM

## 2016-05-05 LAB — POCT URINALYSIS DIPSTICK
Bilirubin, UA: NEGATIVE
Blood, UA: NEGATIVE
GLUCOSE UA: NEGATIVE
Ketones, UA: NEGATIVE
NITRITE UA: NEGATIVE
Protein, UA: NEGATIVE
Spec Grav, UA: 1.01
UROBILINOGEN UA: 0.2
pH, UA: 7

## 2016-05-05 NOTE — Progress Notes (Signed)
Pt states baby movement has decreased since last week.

## 2016-05-05 NOTE — Progress Notes (Signed)
Subjective:    Christine Lambert is a 20 y.o. female being seen today for her obstetrical visit. She is at [redacted]w[redacted]d gestation. Patient reports no complaints. Fetal movement: normal.  Problem List Items Addressed This Visit    None    Visit Diagnoses    Prenatal care, third trimester    -  Primary   Relevant Orders   POCT Urinalysis Dipstick   Encounter for supervision of normal Lambert pregnancy in third trimester         Patient Active Problem List   Diagnosis Date Noted  . Supervision of normal Lambert pregnancy in third trimester 04/28/2016    Objective:    BP 106/70   Pulse 98   Temp 99 F (37.2 C)   Wt 170 lb (77.1 kg)   LMP 09/09/2015 (Approximate) Comment: has been irreg since Depo  BMI 31.09 kg/m  FHT: 145 BPM by doppler + acceleration 15X15 with fetal stimulation  Uterine Size: size equals dates  Presentations: cephalic  Pelvic Exam: deferred     Assessment:    Pregnancy @ [redacted]w[redacted]d weeks    Plan:    Counseled on fetal movement: patient verbalized undestanding Plans for delivery: Vaginal anticipated; labs reviewed; problem list updated Counseling: Consent signed. Infant feeding: plans to breastfeed. Cigarette smoking: never smoked. L&D discussion: symptoms of labor, discussed when to call, discussed what number to call, anesthetic/analgesic options reviewed and delivering clinician:  plans no preference. Postpartum supports and preparation: circumcision discussed and contraception plans discussed.  Follow up in 1 Week.

## 2016-05-07 ENCOUNTER — Encounter (HOSPITAL_COMMUNITY): Payer: Self-pay | Admitting: *Deleted

## 2016-05-07 ENCOUNTER — Other Ambulatory Visit: Payer: Self-pay | Admitting: Obstetrics

## 2016-05-07 ENCOUNTER — Inpatient Hospital Stay (HOSPITAL_COMMUNITY)
Admission: AD | Admit: 2016-05-07 | Discharge: 2016-05-07 | Disposition: A | Discharge status: Home / Self Care | Payer: Medicaid Other | Source: Ambulatory Visit | Attending: Family Medicine | Admitting: Family Medicine

## 2016-05-07 DIAGNOSIS — O9989 Other specified diseases and conditions complicating pregnancy, childbirth and the puerperium: Secondary | ICD-10-CM | POA: Insufficient documentation

## 2016-05-07 DIAGNOSIS — Z3A36 36 weeks gestation of pregnancy: Secondary | ICD-10-CM | POA: Insufficient documentation

## 2016-05-07 DIAGNOSIS — R109 Unspecified abdominal pain: Secondary | ICD-10-CM | POA: Insufficient documentation

## 2016-05-07 NOTE — MAU Note (Signed)
Pain in lower abd started about 30 min ago.  Felt hot.  Has been kind of nauseous since this morning

## 2016-05-07 NOTE — Discharge Instructions (Signed)
Third Trimester of Pregnancy °The third trimester is from week 29 through week 42, months 7 through 9. The third trimester is a time when the fetus is growing rapidly. At the end of the ninth month, the fetus is about 20 inches in length and weighs 6-10 pounds.  °BODY CHANGES °Your body goes through many changes during pregnancy. The changes vary from woman to woman.  °· Your weight will continue to increase. You can expect to gain 25-35 pounds (11-16 kg) by the end of the pregnancy. °· You may begin to get stretch marks on your hips, abdomen, and breasts. °· You may urinate more often because the fetus is moving lower into your pelvis and pressing on your bladder. °· You may develop or continue to have heartburn as a result of your pregnancy. °· You may develop constipation because certain hormones are causing the muscles that push waste through your intestines to slow down. °· You may develop hemorrhoids or swollen, bulging veins (varicose veins). °· You may have pelvic pain because of the weight gain and pregnancy hormones relaxing your joints between the bones in your pelvis. Backaches may result from overexertion of the muscles supporting your posture. °· You may have changes in your hair. These can include thickening of your hair, rapid growth, and changes in texture. Some women also have hair loss during or after pregnancy, or hair that feels dry or thin. Your hair will most likely return to normal after your baby is born. °· Your breasts will continue to grow and be tender. A yellow discharge may leak from your breasts called colostrum. °· Your belly button may stick out. °· You may feel short of breath because of your expanding uterus. °· You may notice the fetus "dropping," or moving lower in your abdomen. °· You may have a bloody mucus discharge. This usually occurs a few days to a week before labor begins. °· Your cervix becomes thin and soft (effaced) near your due date. °WHAT TO EXPECT AT YOUR PRENATAL  EXAMS  °You will have prenatal exams every 2 weeks until week 36. Then, you will have weekly prenatal exams. During a routine prenatal visit: °· You will be weighed to make sure you and the fetus are growing normally. °· Your blood pressure is taken. °· Your abdomen will be measured to track your baby's growth. °· The fetal heartbeat will be listened to. °· Any test results from the previous visit will be discussed. °· You may have a cervical check near your due date to see if you have effaced. °At around 36 weeks, your caregiver will check your cervix. At the same time, your caregiver will also perform a test on the secretions of the vaginal tissue. This test is to determine if a type of bacteria, Group B streptococcus, is present. Your caregiver will explain this further. °Your caregiver may ask you: °· What your birth plan is. °· How you are feeling. °· If you are feeling the baby move. °· If you have had any abnormal symptoms, such as leaking fluid, bleeding, severe headaches, or abdominal cramping. °· If you are using any tobacco products, including cigarettes, chewing tobacco, and electronic cigarettes. °· If you have any questions. °Other tests or screenings that may be performed during your third trimester include: °· Blood tests that check for low iron levels (anemia). °· Fetal testing to check the health, activity level, and growth of the fetus. Testing is done if you have certain medical conditions or if   there are problems during the pregnancy. °· HIV (human immunodeficiency virus) testing. If you are at high risk, you may be screened for HIV during your third trimester of pregnancy. °FALSE LABOR °You may feel small, irregular contractions that eventually go away. These are called Braxton Hicks contractions, or false labor. Contractions may last for hours, days, or even weeks before true labor sets in. If contractions come at regular intervals, intensify, or become painful, it is best to be seen by your  caregiver.  °SIGNS OF LABOR  °· Menstrual-like cramps. °· Contractions that are 5 minutes apart or less. °· Contractions that start on the top of the uterus and spread down to the lower abdomen and back. °· A sense of increased pelvic pressure or back pain. °· A watery or bloody mucus discharge that comes from the vagina. °If you have any of these signs before the 37th week of pregnancy, call your caregiver right away. You need to go to the hospital to get checked immediately. °HOME CARE INSTRUCTIONS  °· Avoid all smoking, herbs, alcohol, and unprescribed drugs. These chemicals affect the formation and growth of the baby. °· Do not use any tobacco products, including cigarettes, chewing tobacco, and electronic cigarettes. If you need help quitting, ask your health care provider. You may receive counseling support and other resources to help you quit. °· Follow your caregiver's instructions regarding medicine use. There are medicines that are either safe or unsafe to take during pregnancy. °· Exercise only as directed by your caregiver. Experiencing uterine cramps is a good sign to stop exercising. °· Continue to eat regular, healthy meals. °· Wear a good support bra for breast tenderness. °· Do not use hot tubs, steam rooms, or saunas. °· Wear your seat belt at all times when driving. °· Avoid raw meat, uncooked cheese, cat litter boxes, and soil used by cats. These carry germs that can cause birth defects in the baby. °· Take your prenatal vitamins. °· Take 1500-2000 mg of calcium daily starting at the 20th week of pregnancy until you deliver your baby. °· Try taking a stool softener (if your caregiver approves) if you develop constipation. Eat more high-fiber foods, such as fresh vegetables or fruit and whole grains. Drink plenty of fluids to keep your urine clear or pale yellow. °· Take warm sitz baths to soothe any pain or discomfort caused by hemorrhoids. Use hemorrhoid cream if your caregiver approves. °· If  you develop varicose veins, wear support hose. Elevate your feet for 15 minutes, 3-4 times a day. Limit salt in your diet. °· Avoid heavy lifting, wear low heal shoes, and practice good posture. °· Rest a lot with your legs elevated if you have leg cramps or low back pain. °· Visit your dentist if you have not gone during your pregnancy. Use a soft toothbrush to brush your teeth and be gentle when you floss. °· A sexual relationship may be continued unless your caregiver directs you otherwise. °· Do not travel far distances unless it is absolutely necessary and only with the approval of your caregiver. °· Take prenatal classes to understand, practice, and ask questions about the labor and delivery. °· Make a trial run to the hospital. °· Pack your hospital bag. °· Prepare the baby's nursery. °· Continue to go to all your prenatal visits as directed by your caregiver. °SEEK MEDICAL CARE IF: °· You are unsure if you are in labor or if your water has broken. °· You have dizziness. °· You have   mild pelvic cramps, pelvic pressure, or nagging pain in your abdominal area. °· You have persistent nausea, vomiting, or diarrhea. °· You have a bad smelling vaginal discharge. °· You have pain with urination. °SEEK IMMEDIATE MEDICAL CARE IF:  °· You have a fever. °· You are leaking fluid from your vagina. °· You have spotting or bleeding from your vagina. °· You have severe abdominal cramping or pain. °· You have rapid weight loss or gain. °· You have shortness of breath with chest pain. °· You notice sudden or extreme swelling of your face, hands, ankles, feet, or legs. °· You have not felt your baby move in over an hour. °· You have severe headaches that do not go away with medicine. °· You have vision changes. °  °This information is not intended to replace advice given to you by your health care provider. Make sure you discuss any questions you have with your health care provider. °  °Document Released: 09/15/2001 Document  Revised: 10/12/2014 Document Reviewed: 11/22/2012 °Elsevier Interactive Patient Education ©2016 [employer] °Fetal Movement Counts °Patient Name: __________________________________________________ Patient Due Date: ____________________ °Performing a fetal movement count is highly recommended in high-risk pregnancies, but it is good for every pregnant woman to do. Your health care provider may ask you to start counting fetal movements at 28 weeks of the pregnancy. Fetal movements often increase: °· After eating a full meal. °· After physical activity. °· After eating or drinking something sweet or cold. °· At rest. °Pay attention to when you feel the baby is most active. This will help you notice a pattern of your baby's sleep and wake cycles and what factors contribute to an increase in fetal movement. It is important to perform a fetal movement count at the same time each day when your baby is normally most active.  °HOW TO COUNT FETAL MOVEMENTS °1. Find a quiet and comfortable area to sit or lie down on your left side. Lying on your left side provides the best blood and oxygen circulation to your baby. °2. Write down the day and time on a sheet of paper or in a journal. °3. Start counting kicks, flutters, swishes, rolls, or jabs in a 2-hour period. You should feel at least 10 movements within 2 hours. °4. If you do not feel 10 movements in 2 hours, wait 2-3 hours and count again. Look for a change in the pattern or not enough counts in 2 hours. °SEEK MEDICAL CARE IF: °· You feel less than 10 counts in 2 hours, tried twice. °· There is no movement in over an hour. °· The pattern is changing or taking longer each day to reach 10 counts in 2 hours. °· You feel the baby is not moving as he or she usually does. °Date: ____________ Movements: ____________ Start time: ____________ Finish time: ____________  °Date: ____________ Movements: ____________ Start time: ____________ Finish time: ____________ °Date:  ____________ Movements: ____________ Start time: ____________ Finish time: ____________ °Date: ____________ Movements: ____________ Start time: ____________ Finish time: ____________ °Date: ____________ Movements: ____________ Start time: ____________ Finish time: ____________ °Date: ____________ Movements: ____________ Start time: ____________ Finish time: ____________ °Date: ____________ Movements: ____________ Start time: ____________ Finish time: ____________ °Date: ____________ Movements: ____________ Start time: ____________ Finish time: ____________  °Date: ____________ Movements: ____________ Start time: ____________ Finish time: ____________ °Date: ____________ Movements: ____________ Start time: ____________ Finish time: ____________ °Date: ____________ Movements: ____________ Start time: ____________ Finish time: ____________ °Date: ____________ Movements: ____________ Start time: ____________ Finish time: ____________ °Date:   ____________ Movements: ____________ Start time: ____________ Doreatha Martin time: ____________ Date: ____________ Movements: ____________ Start time: ____________ Doreatha Martin time: ____________ Date: ____________ Movements: ____________ Start time: ____________ Doreatha Martin time: ____________  Date: ____________ Movements: ____________ Start time: ____________ Doreatha Martin time: ____________ Date: ____________ Movements: ____________ Start time: ____________ Doreatha Martin time: ____________ Date: ____________ Movements: ____________ Start time: ____________ Doreatha Martin time: ____________ Date: ____________ Movements: ____________ Start time: ____________ Doreatha Martin time: ____________ Date: ____________ Movements: ____________ Start time: ____________ Doreatha Martin time: ____________ Date: ____________ Movements: ____________ Start time: ____________ Doreatha Martin time: ____________ Date: ____________ Movements: ____________ Start time: ____________ Doreatha Martin time: ____________  Date: ____________ Movements: ____________ Start  time: ____________ Doreatha Martin time: ____________ Date: ____________ Movements: ____________ Start time: ____________ Doreatha Martin time: ____________ Date: ____________ Movements: ____________ Start time: ____________ Doreatha Martin time: ____________ Date: ____________ Movements: ____________ Start time: ____________ Doreatha Martin time: ____________ Date: ____________ Movements: ____________ Start time: ____________ Doreatha Martin time: ____________ Date: ____________ Movements: ____________ Start time: ____________ Doreatha Martin time: ____________ Date: ____________ Movements: ____________ Start time: ____________ Doreatha Martin time: ____________  Date: ____________ Movements: ____________ Start time: ____________ Doreatha Martin time: ____________ Date: ____________ Movements: ____________ Start time: ____________ Doreatha Martin time: ____________ Date: ____________ Movements: ____________ Start time: ____________ Doreatha Martin time: ____________ Date: ____________ Movements: ____________ Start time: ____________ Doreatha Martin time: ____________ Date: ____________ Movements: ____________ Start time: ____________ Doreatha Martin time: ____________ Date: ____________ Movements: ____________ Start time: ____________ Doreatha Martin time: ____________ Date: ____________ Movements: ____________ Start time: ____________ Doreatha Martin time: ____________  Date: ____________ Movements: ____________ Start time: ____________ Doreatha Martin time: ____________ Date: ____________ Movements: ____________ Start time: ____________ Doreatha Martin time: ____________ Date: ____________ Movements: ____________ Start time: ____________ Doreatha Martin time: ____________ Date: ____________ Movements: ____________ Start time: ____________ Doreatha Martin time: ____________ Date: ____________ Movements: ____________ Start time: ____________ Doreatha Martin time: ____________ Date: ____________ Movements: ____________ Start time: ____________ Doreatha Martin time: ____________ Date: ____________ Movements: ____________ Start time: ____________ Doreatha Martin time: ____________    Date: ____________ Movements: ____________ Start time: ____________ Doreatha Martin time: ____________ Date: ____________ Movements: ____________ Start time: ____________ Doreatha Martin time: ____________ Date: ____________ Movements: ____________ Start time: ____________ Doreatha Martin time: ____________ Date: ____________ Movements: ____________ Start time: ____________ Doreatha Martin time: ____________ Date: ____________ Movements: ____________ Start time: ____________ Doreatha Martin time: ____________ Date: ____________ Movements: ____________ Start time: ____________ Doreatha Martin time: ____________ Date: ____________ Movements: ____________ Start time: ____________ Doreatha Martin time: ____________  Date: ____________ Movements: ____________ Start time: ____________ Doreatha Martin time: ____________ Date: ____________ Movements: ____________ Start time: ____________ Doreatha Martin time: ____________ Date: ____________ Movements: ____________ Start time: ____________ Doreatha Martin time: ____________ Date: ____________ Movements: ____________ Start time: ____________ Doreatha Martin time: ____________ Date: ____________ Movements: ____________ Start time: ____________ Doreatha Martin time: ____________ Date: ____________ Movements: ____________ Start time: ____________ Doreatha Martin time: ____________   This information is not intended to replace advice given to you by your health care provider. Make sure you discuss any questions you have with your health care provider.   Document Released: 10/21/2006 Document Revised: 10/12/2014 Document Reviewed: 07/18/2012 Elsevier Interactive Patient Education [employer]

## 2016-05-13 ENCOUNTER — Encounter: Payer: Self-pay | Admitting: Obstetrics

## 2016-05-13 ENCOUNTER — Ambulatory Visit (INDEPENDENT_AMBULATORY_CARE_PROVIDER_SITE_OTHER): Payer: Medicaid Other | Admitting: Obstetrics

## 2016-05-13 VITALS — BP 122/69 | HR 92 | Wt 172.0 lb

## 2016-05-13 DIAGNOSIS — Z3493 Encounter for supervision of normal pregnancy, unspecified, third trimester: Secondary | ICD-10-CM

## 2016-05-13 NOTE — Progress Notes (Signed)
Subjective:    Christine Lambert is a 20 y.o. female being seen today for her obstetrical visit. She is at 7546w5d gestation. Patient reports itching abdominal stretch marks. Fetal movement: normal.  Problem List Items Addressed This Visit    None    Visit Diagnoses   None.    Patient Active Problem List   Diagnosis Date Noted  . Supervision of normal first pregnancy in third trimester 04/28/2016    Objective:    BP 122/69   Pulse 92   Wt 172 lb (78 kg)   LMP 09/09/2015 (Approximate) Comment: has been irreg since Depo  BMI 31.46 kg/m  FHT: 150 BPM  Uterine Size: size equals dates  Presentations: cephalic    Assessment:    Pregnancy @ 8246w5d weeks   Plan:   Plans for delivery: Vaginal anticipated; labs reviewed; problem list updated Counseling: Consent signed. Infant feeding: plans to breastfeed. Cigarette smoking: never smoked. L&D discussion: symptoms of labor, discussed when to call, discussed what number to call, anesthetic/analgesic options reviewed and delivering clinician:  plans no preference. Postpartum supports and preparation: circumcision discussed and contraception plans discussed.  Follow up in 1 Week.

## 2016-05-19 ENCOUNTER — Ambulatory Visit (INDEPENDENT_AMBULATORY_CARE_PROVIDER_SITE_OTHER): Payer: Medicaid Other | Admitting: Certified Nurse Midwife

## 2016-05-19 VITALS — BP 118/77 | HR 85 | Temp 98.6°F | Wt 173.7 lb

## 2016-05-19 DIAGNOSIS — O2686 Pruritic urticarial papules and plaques of pregnancy (PUPPP): Secondary | ICD-10-CM | POA: Diagnosis not present

## 2016-05-19 DIAGNOSIS — Z3403 Encounter for supervision of normal first pregnancy, third trimester: Secondary | ICD-10-CM

## 2016-05-19 LAB — POCT URINALYSIS DIPSTICK
Bilirubin, UA: NEGATIVE
Glucose, UA: 250
KETONES UA: NEGATIVE
NITRITE UA: NEGATIVE
PH UA: 6
Spec Grav, UA: 1.02
UROBILINOGEN UA: 0.2

## 2016-05-19 MED ORDER — HYDROXYZINE HCL 10 MG PO TABS: 25.0000 mg | ORAL_TABLET | Freq: Three times a day (TID) | ORAL | Status: DC | PRN

## 2016-05-19 NOTE — Progress Notes (Signed)
Subjective:    Christine Lambert is a 20 y.o. female being seen today for her obstetrical visit. She is at 4462w4d gestation. Patient reports no bleeding, no contractions, no cramping, no leaking and itching that started on abdomen and moved to hands and feet now on upper thighs as well. Fetal movement: normal.  Problem List Items Addressed This Visit    None    Visit Diagnoses    Encounter for supervision of normal first pregnancy in third trimester    -  Primary   Relevant Orders   POCT Urinalysis Dipstick (Completed)   PUPP (pruritic urticarial papules and plaques of pregnancy)       Relevant Medications   hydrOXYzine (ATARAX/VISTARIL) tablet 25 mg   Other Relevant Orders   CMP14+CBC/D/Plt+TSH   Hepatic function panel     Patient Active Problem List   Diagnosis Date Noted  . Supervision of normal first pregnancy in third trimester 04/28/2016    Objective:    BP 118/77   Pulse 85   Temp 98.6 F (37 C)   Wt 173 lb 11.2 oz (78.8 kg)   LMP 09/09/2015 (Approximate) Comment: has been irreg since Depo  BMI 31.77 kg/m  FHT: 135 BPM  Uterine Size: 40 cm and size equals dates  Presentations: cephalic  Pelvic Exam: deferred     Assessment:    Pregnancy @ 3562w4d weeks   R/O PUPP Plan:      Labs today for PUPP Plans for delivery: Vaginal anticipated; labs reviewed; problem list updated Counseling: Consent signed. Infant feeding: plans to breastfeed. Cigarette smoking: never smoked. L&D discussion: symptoms of labor, discussed when to call, discussed what number to call, anesthetic/analgesic options reviewed and delivering clinician:  plans no preference. Postpartum supports and preparation: circumcision discussed and contraception plans discussed.  Follow up in 1 Week.

## 2016-05-19 NOTE — Progress Notes (Signed)
Pt c/o rash staring on abdomen 2 weeks ago, has worsened, now spread to thighs, legs, arms, and bottom.  She states rash itches, Benadryl cream helps some with itching, not a lot

## 2016-05-20 LAB — HEPATIC FUNCTION PANEL: BILIRUBIN, DIRECT: 0.05 mg/dL (ref 0.00–0.40)

## 2016-05-20 LAB — CMP14+CBC/D/PLT+TSH
ALBUMIN: 3.6 g/dL (ref 3.5–5.5)
ALT: 18 IU/L (ref 0–32)
AST: 14 IU/L (ref 0–40)
Albumin/Globulin Ratio: 1.5 (ref 1.2–2.2)
Alkaline Phosphatase: 125 IU/L — ABNORMAL HIGH (ref 39–117)
BASOS: 0 %
BUN / CREAT RATIO: 13 (ref 9–23)
BUN: 7 mg/dL (ref 6–20)
Basophils Absolute: 0 10*3/uL (ref 0.0–0.2)
CHLORIDE: 103 mmol/L (ref 96–106)
CO2: 22 mmol/L (ref 18–29)
CREATININE: 0.56 mg/dL — AB (ref 0.57–1.00)
Calcium: 8.9 mg/dL (ref 8.7–10.2)
EOS (ABSOLUTE): 0.3 10*3/uL (ref 0.0–0.4)
EOS: 2 %
GFR, EST AFRICAN AMERICAN: 156 mL/min/{1.73_m2} (ref >59)
GFR, EST NON AFRICAN AMERICAN: 136 mL/min/{1.73_m2} (ref >59)
GLOBULIN, TOTAL: 2.4 g/dL (ref 1.5–4.5)
GLUCOSE: 67 mg/dL (ref 65–99)
HEMATOCRIT: 36.5 % (ref 34.0–46.6)
HEMOGLOBIN: 11.6 g/dL (ref 11.1–15.9)
IMMATURE GRANULOCYTES: 4 %
Immature Grans (Abs): 0.5 10*3/uL — ABNORMAL HIGH (ref 0.0–0.1)
Lymphocytes Absolute: 2 10*3/uL (ref 0.7–3.1)
Lymphs: 18 %
MCH: 29.1 pg (ref 26.6–33.0)
MCHC: 31.8 g/dL (ref 31.5–35.7)
MCV: 92 fL (ref 79–97)
MONOCYTES: 11 %
Monocytes Absolute: 1.2 10*3/uL — ABNORMAL HIGH (ref 0.1–0.9)
NEUTROS PCT: 65 %
Neutrophils Absolute: 7 10*3/uL (ref 1.4–7.0)
Platelets: 332 10*3/uL (ref 150–379)
Potassium: 4 mmol/L (ref 3.5–5.2)
RBC: 3.98 x10E6/uL (ref 3.77–5.28)
RDW: 14.5 % (ref 12.3–15.4)
SODIUM: 141 mmol/L (ref 134–144)
TOTAL PROTEIN: 6 g/dL (ref 6.0–8.5)
TSH: 1.22 u[IU]/mL (ref 0.450–4.500)
WBC: 10.9 10*3/uL — ABNORMAL HIGH (ref 3.4–10.8)

## 2016-05-25 ENCOUNTER — Inpatient Hospital Stay (HOSPITAL_COMMUNITY): Payer: Medicaid Other | Admitting: Anesthesiology

## 2016-05-25 ENCOUNTER — Encounter (HOSPITAL_COMMUNITY): Payer: Self-pay

## 2016-05-25 ENCOUNTER — Inpatient Hospital Stay (HOSPITAL_COMMUNITY)
Admission: AD | Admit: 2016-05-25 | Discharge: 2016-05-28 | DRG: 775 | Disposition: A | Discharge status: Home / Self Care | Payer: Medicaid Other | Source: Ambulatory Visit | Attending: Family Medicine | Admitting: Family Medicine

## 2016-05-25 DIAGNOSIS — Z3A39 39 weeks gestation of pregnancy: Secondary | ICD-10-CM

## 2016-05-25 DIAGNOSIS — O9081 Anemia of the puerperium: Secondary | ICD-10-CM | POA: Diagnosis not present

## 2016-05-25 DIAGNOSIS — IMO0001 Reserved for inherently not codable concepts without codable children: Secondary | ICD-10-CM

## 2016-05-25 DIAGNOSIS — D649 Anemia, unspecified: Secondary | ICD-10-CM | POA: Diagnosis not present

## 2016-05-25 DIAGNOSIS — O4202 Full-term premature rupture of membranes, onset of labor within 24 hours of rupture: Secondary | ICD-10-CM | POA: Diagnosis present

## 2016-05-25 DIAGNOSIS — O4290 Premature rupture of membranes, unspecified as to length of time between rupture and onset of labor, unspecified weeks of gestation: Secondary | ICD-10-CM | POA: Diagnosis present

## 2016-05-25 DIAGNOSIS — Z87891 Personal history of nicotine dependence: Secondary | ICD-10-CM

## 2016-05-25 HISTORY — DX: Unspecified osteoarthritis, unspecified site: M19.90

## 2016-05-25 HISTORY — DX: Headache: R51

## 2016-05-25 HISTORY — DX: Headache, unspecified: R51.9

## 2016-05-25 LAB — TYPE AND SCREEN
ABO/RH(D): B POS
ANTIBODY SCREEN: NEGATIVE

## 2016-05-25 LAB — CBC
HCT: 36.4 % (ref 36.0–46.0)
Hemoglobin: 12.4 g/dL (ref 12.0–15.0)
MCH: 29.9 pg (ref 26.0–34.0)
MCHC: 34.1 g/dL (ref 30.0–36.0)
MCV: 87.7 fL (ref 78.0–100.0)
PLATELETS: 325 10*3/uL (ref 150–400)
RBC: 4.15 MIL/uL (ref 3.87–5.11)
RDW: 14.7 % (ref 11.5–15.5)
WBC: 14.8 10*3/uL — AB (ref 4.0–10.5)

## 2016-05-25 LAB — ABO/RH: ABO/RH(D): B POS

## 2016-05-25 LAB — POCT FERN TEST: POCT Fern Test: POSITIVE

## 2016-05-25 MED ORDER — OXYTOCIN BOLUS FROM INFUSION
500.0000 mL | Freq: Once | INTRAVENOUS | Status: AC
  Administered 2016-05-26: 500 mL via INTRAVENOUS

## 2016-05-25 MED ORDER — OXYCODONE-ACETAMINOPHEN 5-325 MG PO TABS: 1.0000 | ORAL_TABLET | ORAL | Status: DC | PRN

## 2016-05-25 MED ORDER — OXYCODONE-ACETAMINOPHEN 5-325 MG PO TABS: 2.0000 | ORAL_TABLET | ORAL | Status: DC | PRN

## 2016-05-25 MED ORDER — SOD CITRATE-CITRIC ACID 500-334 MG/5ML PO SOLN: 30.0000 mL | ORAL | Status: DC | PRN

## 2016-05-25 MED ORDER — LACTATED RINGERS IV SOLN
INTRAVENOUS | Status: DC
  Administered 2016-05-26 (×2): via INTRAVENOUS

## 2016-05-25 MED ORDER — DIPHENHYDRAMINE HCL 50 MG/ML IJ SOLN: 12.5000 mg | INTRAMUSCULAR | Status: DC | PRN

## 2016-05-25 MED ORDER — ACETAMINOPHEN 325 MG PO TABS: 650.0000 mg | ORAL_TABLET | ORAL | Status: DC | PRN

## 2016-05-25 MED ORDER — TERBUTALINE SULFATE 1 MG/ML IJ SOLN
0.2500 mg | Freq: Once | INTRAMUSCULAR | Status: DC | PRN
  Filled 2016-05-25: qty 1

## 2016-05-25 MED ORDER — SODIUM BICARBONATE 8.4 % IV SOLN
INTRAVENOUS | Status: DC | PRN
  Administered 2016-05-25: 6 mL via EPIDURAL

## 2016-05-25 MED ORDER — EPHEDRINE 5 MG/ML INJ
10.0000 mg | INTRAVENOUS | Status: DC | PRN
  Filled 2016-05-25: qty 4

## 2016-05-25 MED ORDER — ONDANSETRON HCL 4 MG/2ML IJ SOLN: 4.0000 mg | Freq: Four times a day (QID) | INTRAMUSCULAR | Status: DC | PRN

## 2016-05-25 MED ORDER — FENTANYL CITRATE (PF) 100 MCG/2ML IJ SOLN
100.0000 ug | INTRAMUSCULAR | Status: DC | PRN
  Administered 2016-05-25: 100 ug via INTRAVENOUS

## 2016-05-25 MED ORDER — LIDOCAINE HCL (PF) 1 % IJ SOLN
30.0000 mL | INTRAMUSCULAR | Status: DC | PRN
  Administered 2016-05-26: 30 mL via SUBCUTANEOUS
  Filled 2016-05-25: qty 30

## 2016-05-25 MED ORDER — FENTANYL 2.5 MCG/ML BUPIVACAINE 1/10 % EPIDURAL INFUSION (WH - ANES)
14.0000 mL/h | INTRAMUSCULAR | Status: DC | PRN
Start: 2016-05-25 — End: 2016-05-26
  Administered 2016-05-25 – 2016-05-26 (×3): 14 mL/h via EPIDURAL
  Filled 2016-05-25 (×3): qty 125

## 2016-05-25 MED ORDER — LACTATED RINGERS IV SOLN: 500.0000 mL | Freq: Once | INTRAVENOUS | Status: DC

## 2016-05-25 MED ORDER — PHENYLEPHRINE 40 MCG/ML (10ML) SYRINGE FOR IV PUSH (FOR BLOOD PRESSURE SUPPORT)
80.0000 ug | PREFILLED_SYRINGE | INTRAVENOUS | Status: DC | PRN
  Filled 2016-05-25: qty 5

## 2016-05-25 MED ORDER — FENTANYL CITRATE (PF) 100 MCG/2ML IJ SOLN
INTRAMUSCULAR | Status: AC
  Filled 2016-05-25: qty 2

## 2016-05-25 MED ORDER — OXYTOCIN 40 UNITS IN LACTATED RINGERS INFUSION - SIMPLE MED
1.0000 m[IU]/min | INTRAVENOUS | Status: DC
  Administered 2016-05-25: 2 m[IU]/min via INTRAVENOUS

## 2016-05-25 MED ORDER — PHENYLEPHRINE 40 MCG/ML (10ML) SYRINGE FOR IV PUSH (FOR BLOOD PRESSURE SUPPORT)
80.0000 ug | PREFILLED_SYRINGE | INTRAVENOUS | Status: DC | PRN
  Filled 2016-05-25: qty 10
  Filled 2016-05-25: qty 5

## 2016-05-25 MED ORDER — LACTATED RINGERS IV SOLN: 500.0000 mL | INTRAVENOUS | Status: DC | PRN

## 2016-05-25 MED ORDER — OXYTOCIN 40 UNITS IN LACTATED RINGERS INFUSION - SIMPLE MED
2.5000 [IU]/h | INTRAVENOUS | Status: DC
  Administered 2016-05-26: 2.5 [IU]/h via INTRAVENOUS
  Filled 2016-05-25: qty 1000

## 2016-05-25 MED ORDER — FLEET ENEMA 7-19 GM/118ML RE ENEM: 1.0000 | ENEMA | RECTAL | Status: DC | PRN

## 2016-05-25 NOTE — Anesthesia Pain Management Evaluation Note (Signed)
  [facility] Pain Management Visit Note  Patient: Christine Lambert, 20 y.o., female  "Hello I am a member of the anesthesia team at [facility]. We have an anesthesia team available at all times to provide care throughout the hospital, including epidural management and anesthesia for C-section. I don't know your plan for the delivery whether it a natural birth, water birth, IV sedation, nitrous supplementation, doula or epidural, but we want to meet your pain goals."   1.Was your pain managed to your expectations on prior hospitalizations? yes    2.What is your expectation for pain management during this hospitalization?   nitrous    3.How can we help you reach that goal? Nursing intervention  Record the patient's initial score and the patient's pain goal.   Pain: 6  Pain Goal: 2 [facility] wants you to be able to say your pain was always managed very well.  Christine Lambert, Christine Lambert 05/25/2016

## 2016-05-25 NOTE — MAU Note (Signed)
Having lower abdominal pain.  Possible ROM, underwear has felt damp.

## 2016-05-25 NOTE — MAU Note (Signed)
Checked for pooling, Positive ferning.  Called Resident for admission orders.

## 2016-05-25 NOTE — H&P (Signed)
LABOR AND DELIVERY ADMISSION HISTORY AND PHYSICAL NOTE  Christine Lambert is a 20 y.o. female G1P0000 with IUP at 1010w3d by 19.6 wk U/S presenting for PROM. States noticed leaking of clear fluid around midnight last night. Is only feeling irregular contractions that are mild.   She reports positive fetal movement. She denies vaginal bleeding.  Prenatal History/Complications: PUPP  Past Medical History: Past Medical History:  Diagnosis Date  . 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 Headache    migraines    Past Surgical History: Past Surgical History:  Procedure Laterality Date  . NO PAST SURGERIES      Obstetrical History: OB History    Gravida Para Term Preterm AB Living   1 0 0 0 0 0   SAB TAB Ectopic Multiple Live Births   0 0 0 0 0      Social History: Social History   Social History  . Marital status: Single    Spouse name: N/A  . Number of children: N/A  . Years of education: N/A   Social History Main Topics  . Smoking status: Former Smoker    Quit date: 08/18/2015  . Smokeless tobacco: Never Used  . Alcohol use No  . Drug use: No  . Sexual activity: Not Currently    Birth control/ protection: None   Other Topics Concern  . None   Social History Narrative  . None    Family History: History reviewed. No pertinent family history.  Allergies: No Known Allergies  Facility-Administered Medications Prior to Admission  Medication Dose Route Frequency Provider Last Rate Last Dose  . hydrOXYzine (ATARAX/VISTARIL) tablet 25 mg  25 mg Oral TID PRN Roe Coombsachelle A Denney, CNM       Prescriptions Prior to Admission  Medication Sig Dispense Refill Last Dose  . Cetirizine HCl (ZYRTEC ALLERGY) 10 MG CAPS Take 1 capsule (10 mg total) by mouth daily. (Patient taking differently: Take 1 capsule by mouth at bedtime as needed (for allergies). ) 30 capsule 11 Past Week at Unknown time  . diphenhydrAMINE-zinc acetate (BENADRYL) cream Apply 1  application topically 3 (three) times daily as needed for itching.   05/24/2016 at Unknown time  . Prenatal Vit-Fe Phos-FA-Omega (VITAFOL GUMMIES) 3.33-0.333-34.8 MG CHEW CHEW THREE GUMMIES BY MOUTH ONCE DAILY BEFORE BREAKFAST 90 tablet 0 05/25/2016 at Unknown time     Review of Systems   All systems reviewed and negative except as stated in HPI  Blood pressure 108/79, pulse 84, temperature 98.9 F (37.2 C), temperature source Oral, resp. rate 20, height 5\' 2"  (1.575 m), weight 78.5 kg (173 lb), last menstrual period 09/09/2015. General appearance: alert, cooperative and no distress Lungs: no respiratory distress Heart: regular rate Abdomen: soft, non-tender Extremities: No calf swelling or tenderness Presentation: cephalic by ultrasound @1912  Fetal monitoring: 150 baseline, moderate variability, + accelerations, no decelerations Uterine activity: q3-615min Dilation: 3 Effacement (%): 60 Station: -2 Exam by:: Ginnie Smartachel Schmidt RN   Prenatal labs: ABO, Rh: B/POS/-- (02/13 1408) Antibody: NEG (02/13 1408) Rubella: immune RPR: Non Reactive (05/30 1130)  HBsAg: NEGATIVE (02/13 1408)  HIV: Non Reactive (05/30 1130)  GBS: Negative (07/25 1600)  Genetic screening:  negative Anatomy US: L sided choroid plexus cyst  Prenatal Transfer Tool  Maternal Diabetes: No Genetic Screening: Normal Maternal Ultrasounds/Referrals: Abnormal:  Findings:   Isolated choroid plexus cyst on L Fetal Ultrasounds or other Referrals:  None Maternal Substance Abuse:  No Significant Maternal Medications:  None Significant Maternal  Lab Results: Lab values include: Group B Strep negative  Results for orders placed or performed during the hospital encounter of 05/25/16 (from the past 24 hour(s))  Fern Test   Collection Time: 05/25/16  3:24 PM  Result Value Ref Range   POCT Fern Test Positive = ruptured amniotic membanes   CBC   Collection Time: 05/25/16  6:00 PM  Result Value Ref Range   WBC 14.8 (H) 4.0 -  10.5 K/uL   RBC 4.15 3.87 - 5.11 MIL/uL   Hemoglobin 12.4 12.0 - 15.0 g/dL   HCT 16.136.4 09.636.0 - 04.546.0 %   MCV 87.7 78.0 - 100.0 fL   MCH 29.9 26.0 - 34.0 pg   MCHC 34.1 30.0 - 36.0 g/dL   RDW 40.914.7 81.111.5 - 91.415.5 %   Platelets 325 150 - 400 K/uL    Patient Active Problem List   Diagnosis Date Noted  . PROM (premature rupture of membranes) 05/25/2016  . Supervision of normal first pregnancy in third trimester 04/28/2016    Assessment: Christine Lambert is a 20 y.o. G1P0000 at 3992w3d here for PROM.  #Labor:pitocin #Pain: Nitrous oxide #FWB: Category  #ID:  GBS negative #MOF: breast  #MOC:depo #Circ:  Girl, N/A  Leland HerElsia J Yoo, DO PGY-1 8/21/20176:38 PM   Midwife attestation: I have seen and examined this patient; I agree with above documentation in the resident's note.   Christine Lambert is a 20 y.o. G1P1001 @[redacted]w[redacted]d  here for PROM.  PE: BP 112/68 (BP Location: Right Arm)   Pulse 90   Temp 98.1 F (36.7 C) (Oral)   Resp 20   Ht 5\' 2"  (1.575 m)   Wt 173 lb (78.5 kg)   LMP 09/09/2015 (Approximate) Comment: has been irreg since Depo  SpO2 100%   Breastfeeding? Unknown   BMI 31.64 kg/m  Gen: calm comfortable, NAD Resp: normal effort, no distress Abd: gravid  ROS, labs, PMH reviewed  Plan: Admit to LD Labor: Cervical exam done in MAU and pt having irregular contractions/early labor signs.  Start Pitocin 2 milliunits/min, increase by 2 every 30 minutes until adequate. FWB: Category I ID: GBS negative  LEFTWICH-KIRBY, Jaire Pinkham, CNM  05/27/2016, 9:06 AM

## 2016-05-25 NOTE — Anesthesia Preprocedure Evaluation (Signed)
Anesthesia Evaluation  Patient identified by MRN, date of birth, ID band Patient awake    Reviewed: Allergy & Precautions, NPO status , Patient's Chart, lab work & pertinent test results  Airway Mallampati: II  TM Distance: >3 FB Neck ROM: Full    Dental no notable dental hx.    Pulmonary neg pulmonary ROS, former smoker,    Pulmonary exam normal breath sounds clear to auscultation       Cardiovascular negative cardio ROS Normal cardiovascular exam Rhythm:Regular Rate:Normal     Neuro/Psych negative neurological ROS  negative psych ROS   GI/Hepatic negative GI ROS, Neg liver ROS,   Endo/Other  negative endocrine ROS  Renal/GU negative Renal ROS  negative genitourinary   Musculoskeletal negative musculoskeletal ROS (+)   Abdominal   Peds negative pediatric ROS (+)  Hematology negative hematology ROS (+)   Anesthesia Other Findings   Reproductive/Obstetrics negative OB ROS                             Anesthesia Physical Anesthesia Plan  ASA: II  Anesthesia Plan: Epidural   Post-op Pain Management:    Induction:   Airway Management Planned: Natural Airway  Additional Equipment:   Intra-op Plan:   Post-operative Plan:   Informed Consent: I have reviewed the patients History and Physical, chart, labs and discussed the procedure including the risks, benefits and alternatives for the proposed anesthesia with the patient or authorized representative who has indicated his/her understanding and acceptance.     Plan Discussed with: CRNA  Anesthesia Plan Comments: (Informed consent obtained prior to proceeding including risk of failure, 1% risk of PDPH, risk of minor discomfort and bruising.  Discussed rare but serious complications including epidural abscess, permanent nerve injury, epidural hematoma.  Discussed alternatives to epidural analgesia and patient desires to proceed.   Timeout performed pre-procedure verifying patient name, procedure, and platelet count.  Patient tolerated procedure well.)        Anesthesia Quick Evaluation

## 2016-05-26 ENCOUNTER — Encounter (HOSPITAL_COMMUNITY): Payer: Self-pay | Admitting: *Deleted

## 2016-05-26 DIAGNOSIS — Z3A39 39 weeks gestation of pregnancy: Secondary | ICD-10-CM

## 2016-05-26 DIAGNOSIS — Z87891 Personal history of nicotine dependence: Secondary | ICD-10-CM

## 2016-05-26 DIAGNOSIS — IMO0001 Reserved for inherently not codable concepts without codable children: Secondary | ICD-10-CM

## 2016-05-26 DIAGNOSIS — O4202 Full-term premature rupture of membranes, onset of labor within 24 hours of rupture: Secondary | ICD-10-CM

## 2016-05-26 DIAGNOSIS — O9081 Anemia of the puerperium: Secondary | ICD-10-CM

## 2016-05-26 LAB — RPR: RPR Ser Ql: NONREACTIVE

## 2016-05-26 MED ORDER — OXYCODONE-ACETAMINOPHEN 5-325 MG PO TABS
2.0000 | ORAL_TABLET | ORAL | Status: DC | PRN
Start: 2016-05-26 — End: 2016-05-26

## 2016-05-26 MED ORDER — ACETAMINOPHEN 325 MG PO TABS: 650.0000 mg | ORAL_TABLET | ORAL | Status: DC | PRN

## 2016-05-26 MED ORDER — SENNOSIDES-DOCUSATE SODIUM 8.6-50 MG PO TABS
2.0000 | ORAL_TABLET | ORAL | Status: DC
  Administered 2016-05-26 – 2016-05-27 (×2): 2 via ORAL
  Filled 2016-05-26 (×2): qty 2

## 2016-05-26 MED ORDER — SOD CITRATE-CITRIC ACID 500-334 MG/5ML PO SOLN: 30.0000 mL | ORAL | Status: DC | PRN

## 2016-05-26 MED ORDER — BENZOCAINE-MENTHOL 20-0.5 % EX AERO
1.0000 "application " | INHALATION_SPRAY | CUTANEOUS | Status: DC | PRN
  Administered 2016-05-26: 1 via TOPICAL
  Filled 2016-05-26: qty 56

## 2016-05-26 MED ORDER — ZOLPIDEM TARTRATE 5 MG PO TABS: 5.0000 mg | ORAL_TABLET | Freq: Every evening | ORAL | Status: DC | PRN

## 2016-05-26 MED ORDER — OXYTOCIN 40 UNITS IN LACTATED RINGERS INFUSION - SIMPLE MED: 2.5000 [IU]/h | INTRAVENOUS | Status: DC

## 2016-05-26 MED ORDER — WITCH HAZEL-GLYCERIN EX PADS: 1.0000 "application " | MEDICATED_PAD | CUTANEOUS | Status: DC | PRN

## 2016-05-26 MED ORDER — LIDOCAINE HCL (PF) 1 % IJ SOLN
30.0000 mL | INTRAMUSCULAR | Status: DC | PRN
  Filled 2016-05-26: qty 30

## 2016-05-26 MED ORDER — ONDANSETRON HCL 4 MG/2ML IJ SOLN: 4.0000 mg | Freq: Four times a day (QID) | INTRAMUSCULAR | Status: DC | PRN

## 2016-05-26 MED ORDER — LIDOCAINE HCL (PF) 1 % IJ SOLN
INTRAMUSCULAR | Status: DC | PRN
  Administered 2016-05-25 (×2): 6 mL

## 2016-05-26 MED ORDER — LACTATED RINGERS IV SOLN: INTRAVENOUS | Status: DC

## 2016-05-26 MED ORDER — SIMETHICONE 80 MG PO CHEW: 80.0000 mg | CHEWABLE_TABLET | ORAL | Status: DC | PRN

## 2016-05-26 MED ORDER — IBUPROFEN 600 MG PO TABS
600.0000 mg | ORAL_TABLET | Freq: Four times a day (QID) | ORAL | Status: DC
  Administered 2016-05-26 – 2016-05-28 (×9): 600 mg via ORAL
  Filled 2016-05-26 (×8): qty 1

## 2016-05-26 MED ORDER — LACTATED RINGERS IV SOLN: 500.0000 mL | Freq: Once | INTRAVENOUS | Status: DC

## 2016-05-26 MED ORDER — ACETAMINOPHEN 325 MG PO TABS
650.0000 mg | ORAL_TABLET | ORAL | Status: DC | PRN
  Administered 2016-05-26: 650 mg via ORAL
  Filled 2016-05-26: qty 2

## 2016-05-26 MED ORDER — COCONUT OIL OIL: 1.0000 "application " | TOPICAL_OIL | Status: DC | PRN

## 2016-05-26 MED ORDER — ONDANSETRON HCL 4 MG PO TABS: 4.0000 mg | ORAL_TABLET | ORAL | Status: DC | PRN

## 2016-05-26 MED ORDER — PNEUMOCOCCAL VAC POLYVALENT 25 MCG/0.5ML IJ INJ
0.5000 mL | INJECTION | INTRAMUSCULAR | Status: DC
  Filled 2016-05-26: qty 0.5

## 2016-05-26 MED ORDER — LACTATED RINGERS IV SOLN
500.0000 mL | INTRAVENOUS | Status: DC | PRN
Start: 2016-05-26 — End: 2016-05-26

## 2016-05-26 MED ORDER — DIPHENHYDRAMINE HCL 25 MG PO CAPS: 25.0000 mg | ORAL_CAPSULE | Freq: Four times a day (QID) | ORAL | Status: DC | PRN

## 2016-05-26 MED ORDER — PRENATAL MULTIVITAMIN CH
1.0000 | ORAL_TABLET | Freq: Every day | ORAL | Status: DC
  Administered 2016-05-27 – 2016-05-28 (×2): 1 via ORAL
  Filled 2016-05-26 (×2): qty 1

## 2016-05-26 MED ORDER — OXYCODONE-ACETAMINOPHEN 5-325 MG PO TABS: 1.0000 | ORAL_TABLET | ORAL | Status: DC | PRN

## 2016-05-26 MED ORDER — ONDANSETRON HCL 4 MG/2ML IJ SOLN
4.0000 mg | INTRAMUSCULAR | Status: DC | PRN
Start: 2016-05-26 — End: 2016-05-28

## 2016-05-26 MED ORDER — OXYTOCIN BOLUS FROM INFUSION: 500.0000 mL | Freq: Once | INTRAVENOUS | Status: DC

## 2016-05-26 MED ORDER — DIBUCAINE 1 % RE OINT: 1.0000 "application " | TOPICAL_OINTMENT | RECTAL | Status: DC | PRN

## 2016-05-26 MED ORDER — FLEET ENEMA 7-19 GM/118ML RE ENEM: 1.0000 | ENEMA | Freq: Once | RECTAL | Status: DC

## 2016-05-26 MED ORDER — TETANUS-DIPHTH-ACELL PERTUSSIS 5-2.5-18.5 LF-MCG/0.5 IM SUSP
0.5000 mL | Freq: Once | INTRAMUSCULAR | Status: AC
  Administered 2016-05-28: 0.5 mL via INTRAMUSCULAR
  Filled 2016-05-26: qty 0.5

## 2016-05-26 NOTE — Anesthesia Procedure Notes (Signed)
Epidural Patient location during procedure: OB Start time: 05/25/2016 11:00 PM  Staffing Anesthesiologist: Sherrian DiversENENNY, Eilidh Marcano  Preanesthetic Checklist Completed: patient identified, site marked, surgical consent, pre-op evaluation, timeout performed, IV checked, risks and benefits discussed and monitors and equipment checked  Epidural Patient position: sitting Prep: DuraPrep Patient monitoring: blood pressure and heart rate Approach: midline Location: L4-L5 Injection technique: LOR saline  Needle:  Needle type: Tuohy  Needle gauge: 17 G Needle length: 9 cm Needle insertion depth: 5 cm Catheter type: closed end flexible Catheter size: 19 Gauge Catheter at skin depth: 13 cm Test dose: negative and Other  Assessment Events: blood not aspirated, injection not painful, no injection resistance, negative IV test and no paresthesia  Additional Notes Reason for block:procedure for pain

## 2016-05-27 LAB — CBC
HEMATOCRIT: 27.4 % — AB (ref 36.0–46.0)
HEMOGLOBIN: 9.3 g/dL — AB (ref 12.0–15.0)
MCH: 29.1 pg (ref 26.0–34.0)
MCHC: 33.9 g/dL (ref 30.0–36.0)
MCV: 85.6 fL (ref 78.0–100.0)
Platelets: 254 10*3/uL (ref 150–400)
RBC: 3.2 MIL/uL — AB (ref 3.87–5.11)
RDW: 14.8 % (ref 11.5–15.5)
WBC: 17.8 10*3/uL — ABNORMAL HIGH (ref 4.0–10.5)

## 2016-05-27 MED ORDER — OXYCODONE-ACETAMINOPHEN 5-325 MG PO TABS
1.0000 | ORAL_TABLET | ORAL | Status: DC | PRN
  Administered 2016-05-27 – 2016-05-28 (×5): 1 via ORAL
  Filled 2016-05-27 (×5): qty 1

## 2016-05-27 MED ORDER — POLYSACCHARIDE IRON COMPLEX 150 MG PO CAPS
150.0000 mg | ORAL_CAPSULE | Freq: Two times a day (BID) | ORAL | Status: DC
  Administered 2016-05-27 – 2016-05-28 (×2): 150 mg via ORAL
  Filled 2016-05-27 (×2): qty 1

## 2016-05-27 NOTE — Progress Notes (Signed)
UR chart review completed.  

## 2016-05-27 NOTE — Anesthesia Postprocedure Evaluation (Signed)
Anesthesia Post Note  Patient: Christine Lambert  Procedure(s) Performed: * No procedures listed *  Patient location during evaluation: Mother Baby Anesthesia Type: Epidural Level of consciousness: awake and alert Pain management: satisfactory to patient Vital Signs Assessment: post-procedure vital signs reviewed and stable Respiratory status: respiratory function stable Cardiovascular status: stable Postop Assessment: no headache, no backache, epidural receding, patient able to bend at knees, no signs of nausea or vomiting and adequate PO intake Anesthetic complications: no     Last Vitals:  Vitals:   05/26/16 2240 05/27/16 0300  BP: 119/66 112/68  Pulse: (!) 103 90  Resp: 18 20  Temp: 37.6 C 36.7 C    Last Pain:  Vitals:   05/27/16 0815  TempSrc:   PainSc: 9    Pain Goal: Patients Stated Pain Goal: 7 (05/26/16 0756)               Karleen DolphinFUSSELL,Luisfernando Brightwell

## 2016-05-27 NOTE — Lactation Note (Signed)
This note was copied from a baby's chart. Lactation Consultation Note  Baby sleeping STS on mother's chest.  Mother states she has been taught hand expression and view drops. Reviewed pp 22-26 in Baby & Me booklet. Suggest mother Lambert for assistance w/ next feeding to view latch. Mom encouraged to feed baby 8-12 times/24 hours and with feeding cues.  Mom made aware of O/P services, breastfeeding support groups, community resources, and our phone # for post-discharge questions.    Patient Name: Christine Lambert Today's Date: 05/27/2016     Maternal Data    Feeding Feeding Type: Breast Fed  LATCH Score/Interventions Latch: Repeated attempts needed to sustain latch, nipple held in mouth throughout feeding, stimulation needed to elicit sucking reflex. Intervention(s): Adjust position;Assist with latch  Audible Swallowing: A few with stimulation Intervention(s): Skin to skin;Hand expression  Type of Nipple: Flat Intervention(s): Reverse pressure  Comfort (Breast/Nipple): Soft / non-tender     Hold (Positioning): Assistance needed to correctly position infant at breast and maintain latch.  LATCH Score: 6  Lactation Tools Discussed/Used     Consult Status      Dahlia ByesBerkelhammer, Ruth Roosevelt [facility] 05/27/2016, 11:25 AM

## 2016-05-27 NOTE — Progress Notes (Signed)
Post Partum Day #1 Subjective: no complaints, up ad lib, voiding and tolerating PO. Working with breast feeding.    Objective: Blood pressure 112/68, pulse 90, temperature 98.1 F (36.7 C), temperature source Oral, resp. rate 20, height 5\' 2"  (1.575 m), weight 173 lb (78.5 kg), last menstrual period 09/09/2015, SpO2 100 %, unknown if currently breastfeeding.  Physical Exam:  General: alert, cooperative and no distress Lochia: appropriate Uterine Fundus: firm Incision: no significant drainage, no significant erythema DVT Evaluation: No evidence of DVT seen on physical exam. No cords or calf tenderness. No significant calf/ankle edema.   Recent Labs  05/25/16 1800 05/27/16 0512  HGB 12.4 9.3*  HCT 36.4 27.4*    Assessment/Plan: Plan for discharge tomorrow, Breastfeeding, Lactation consult and Contraception planning depo injections.  Anemia: starting Niferex   LOS: 2 days   Roe CoombsRachelle A Doniel Maiello, CNM 05/27/2016, 12:59 PM

## 2016-05-28 ENCOUNTER — Encounter (HOSPITAL_COMMUNITY): Payer: Self-pay | Admitting: *Deleted

## 2016-05-28 MED ORDER — MEDROXYPROGESTERONE ACETATE 150 MG/ML IM SUSP: 150.0000 mg | INTRAMUSCULAR | 4 refills | Status: DC

## 2016-05-28 MED ORDER — IBUPROFEN 600 MG PO TABS: 600.0000 mg | ORAL_TABLET | Freq: Four times a day (QID) | ORAL | 2 refills | Status: DC

## 2016-05-28 MED ORDER — BENZOCAINE-MENTHOL 20-0.5 % EX AERO: 1.0000 "application " | INHALATION_SPRAY | CUTANEOUS | 0 refills | Status: DC | PRN

## 2016-05-28 MED ORDER — SENNOSIDES-DOCUSATE SODIUM 8.6-50 MG PO TABS: 2.0000 | ORAL_TABLET | ORAL | 2 refills | Status: DC

## 2016-05-28 MED ORDER — POLYSACCHARIDE IRON COMPLEX 150 MG PO CAPS: 150.0000 mg | ORAL_CAPSULE | Freq: Two times a day (BID) | ORAL | 4 refills | Status: DC

## 2016-05-28 MED ORDER — COCONUT OIL OIL
1.0000 | TOPICAL_OIL | 99 refills | Status: DC | PRN
Start: 2016-05-28 — End: 2017-10-04

## 2016-05-28 MED ORDER — OXYCODONE-ACETAMINOPHEN 5-325 MG PO TABS: 1.0000 | ORAL_TABLET | ORAL | 0 refills | Status: DC | PRN

## 2016-05-28 NOTE — Lactation Note (Signed)
This note was copied from a baby's chart. Lactation Consultation Note: Mother breastfeeding infant when I arrived in the room for follow up. Infant is latched on the (L) breast without the nipple shield. Infants lip widely gaped. Observed audible swallows and burst of suckling and swallows. Mother states that she used her harmony hand pump to firm her nipple before latching. She states this works well. Suggested to mother to pump for 15 min on each breast after breastfeeding. Mother has an electric pump at home she plans to use. Advised to use pump at least 4 times daily if she is using the nipple shield. Discussed engorgement treatment and care of her breast while engorge. Mother receptive to all teaching.   Patient Name: Christine Lambert ZOXWR'UToday's Date: 05/28/2016 Reason for consult: Follow-up assessment   Maternal Data    Feeding Feeding Type: Breast Fed Length of feed: 10 min  LATCH Score/Interventions Latch: Repeated attempts needed to sustain latch, nipple held in mouth throughout feeding, stimulation needed to elicit sucking reflex.  Audible Swallowing: A few with stimulation  Type of Nipple: Everted at rest and after stimulation (with hand pump) Intervention(s): Hand pump  Comfort (Breast/Nipple): Soft / non-tender     Hold (Positioning): No assistance needed to correctly position infant at breast.  LATCH Score: 8  Lactation Tools Discussed/Used Tools:  (no nipple shield) Nipple shield size: 16   Consult Status Consult Status: Complete    Michel BickersKendrick, Marshea Wisher McCoy 05/28/2016, 9:46 AM

## 2016-05-28 NOTE — Lactation Note (Signed)
This note was copied from a baby's chart. Lactation Consultation Note Mom having difficulty latching baby. Mom has pendulum breast w/tiny nipple. Very compressible. Mom states baby will not latch well, crying and hasn't BF well. RN fitted #16 NS to Lt. Nipple in football hold. Baby BF great having good latch BF w/o pain. Noted Lt. Breast softer than Rt. Breast after BF on Lt. Breast. Unlatched mom has mature milk in NS. Encouraged comfort during BF so colostrum flows better and mom will enjoy the feeding longer. Taking deep breaths and breast massage during BF.  Discussed positioning, latching, and cluster feeding.   Patient Name: [name] JXBJY'NToday's Date: Lambert Reason for consult: Follow-up assessment;Difficult latch   Maternal Data Has patient been taught Hand Expression?: Yes Does the patient have breastfeeding experience prior to this delivery?: No  Feeding Feeding Type: Breast Fed Length of feed: 15 min (still BF)  LATCH Score/Interventions Latch: Repeated attempts needed to sustain latch, nipple held in mouth throughout feeding, stimulation needed to elicit sucking reflex. Intervention(s): Skin to skin;Teach feeding cues;Waking techniques Intervention(s): Adjust position;Assist with latch;Breast massage;Breast compression  Audible Swallowing: Spontaneous and intermittent Intervention(s): Skin to skin;Hand expression;Alternate breast massage  Type of Nipple: Everted at rest and after stimulation (semi flat)  Comfort (Breast/Nipple): Soft / non-tender     Hold (Positioning): Assistance needed to correctly position infant at breast and maintain latch. Intervention(s): Breastfeeding basics reviewed;Support Pillows;Position options;Skin to skin  LATCH Score: 8  Lactation Tools Discussed/Used Tools: Nipple Shields Nipple shield size: 16   Consult Status Consult Status: Follow-up Date: 05/29/16 Follow-up type: In-patient    Charyl DancerCARVER, Christine Lambert,  4:19 AM

## 2016-05-28 NOTE — Discharge Summary (Signed)
Obstetric Discharge Summary Reason for Admission: onset of labor Prenatal Procedures: ultrasound Intrapartum Procedures: spontaneous vaginal delivery Postpartum Procedures: none Complications-Operative and Postpartum: 1ST degree perineal laceration Hemoglobin  Date Value Ref Range Status  05/27/2016 9.3 (L) 12.0 - 15.0 g/dL Final    Comment:    DELTA CHECK NOTED REPEATED TO VERIFY    HCT  Date Value Ref Range Status  05/27/2016 27.4 (L) 36.0 - 46.0 % Final   Hematocrit  Date Value Ref Range Status  05/19/2016 36.5 34.0 - 46.6 % Final    Physical Exam:  General: alert, cooperative and no distress Lochia: appropriate Uterine Fundus: firm Incision: no significant drainage, no dehiscence, no significant erythema DVT Evaluation: No evidence of DVT seen on physical exam. No cords or calf tenderness. No significant calf/ankle edema.  Discharge Diagnoses: Term Pregnancy-delivered  Discharge Information: Date: 05/28/2016 Activity: pelvic rest Diet: routine Medications: PNV, Ibuprofen, Colace, Iron and Percocet Condition: stable Instructions: refer to practice specific booklet Discharge to: home Follow-up Information    Christine Lambert, CNM. Schedule an appointment as soon as possible for a visit in 2 week(s).   Specialty:  Certified Nurse Midwife Contact information: [address] [place] [id number] [phone]           Newborn Data: Live born female  Birth Weight: 7 lb 8.8 oz (3425 g) APGAR: 7, 8  Home with mother.  Christine Lambert, CNM 05/28/2016, 8:16 AM

## 2016-05-28 NOTE — Progress Notes (Addendum)
Post Partum Day #2 Subjective: no complaints, up ad lib, voiding and tolerating PO. States that she is having difficulty with lactation, currently pumping.    Objective: Blood pressure 116/71, pulse 79, temperature 97.6 F (36.4 C), temperature source Oral, resp. rate 20, height 5\' 2"  (1.575 m), weight 173 lb (78.5 kg), last menstrual period 09/09/2015, SpO2 100 %, unknown if currently breastfeeding.  Physical Exam:  General: alert, cooperative and no distress Lochia: appropriate Uterine Fundus: firm Incision: no significant drainage, no significant erythema DVT Evaluation: No evidence of DVT seen on physical exam. Negative Homan's sign. No cords or calf tenderness. No significant calf/ankle edema.   Recent Labs  05/25/16 1800 05/27/16 0512  HGB 12.4 9.3*  HCT 36.4 27.4*    Assessment/Plan: Discharge home, Breastfeeding, Lactation consult and Contraception Depo injection   LOS: 3 days   Roe CoombsRachelle A Burdell Peed, CNM 05/28/2016, 8:07 AM

## 2016-05-28 NOTE — Plan of Care (Signed)
Problem: Education: Goal: Knowledge of condition will improve Lactation saw patient while RN doing assessments. Gave patient information about caring for breasts once home and nutrition. Gave patient sitz bath and explained use for peri pain. Gave patient instructions on when to call MD.

## 2016-06-09 ENCOUNTER — Other Ambulatory Visit: Payer: Self-pay | Admitting: Certified Nurse Midwife

## 2016-06-09 DIAGNOSIS — Z3042 Encounter for surveillance of injectable contraceptive: Secondary | ICD-10-CM

## 2016-06-09 MED ORDER — MEDROXYPROGESTERONE ACETATE 150 MG/ML IM SUSP: 150.0000 mg | INTRAMUSCULAR | 4 refills | Status: DC

## 2016-06-10 ENCOUNTER — Ambulatory Visit: Payer: Medicaid Other | Admitting: Certified Nurse Midwife

## 2016-06-10 ENCOUNTER — Ambulatory Visit (INDEPENDENT_AMBULATORY_CARE_PROVIDER_SITE_OTHER): Payer: Medicaid Other | Admitting: Certified Nurse Midwife

## 2016-06-10 ENCOUNTER — Encounter: Payer: Self-pay | Admitting: Certified Nurse Midwife

## 2016-06-10 ENCOUNTER — Encounter: Payer: Self-pay | Admitting: *Deleted

## 2016-06-10 VITALS — BP 105/71 | HR 73 | Temp 98.4°F | Wt 150.4 lb

## 2016-06-10 DIAGNOSIS — Z30013 Encounter for initial prescription of injectable contraceptive: Secondary | ICD-10-CM

## 2016-06-10 DIAGNOSIS — Z3042 Encounter for surveillance of injectable contraceptive: Secondary | ICD-10-CM

## 2016-06-10 MED ORDER — MEDROXYPROGESTERONE ACETATE 150 MG/ML IM SUSP
150.0000 mg | INTRAMUSCULAR | Status: AC
  Administered 2016-06-10 – 2017-01-11 (×2): 150 mg via INTRAMUSCULAR

## 2016-06-10 NOTE — Progress Notes (Signed)
Subjective:     Christine Lambert is a 20 y.o. female who presents for a postpartum visit. She is 2 weeks postpartum following a spontaneous vaginal delivery. I have fully reviewed the prenatal and intrapartum course. The delivery was at 39.3 gestational weeks. Outcome: spontaneous vaginal delivery. Anesthesia: epidural. Postpartum course has been normal. Baby's course has been normal. Baby is feeding by breast. Bleeding staining only and brown. Bowel function is normal. Bladder function is normal. Patient is not sexually active. Contraception method is abstinence. Postpartum depression screening: negative.  Tobacco, alcohol and substance abuse history reviewed.  Adult immunizations reviewed including TDAP, rubella and varicella.  The following portions of the patient's history were reviewed and updated as appropriate: allergies, current medications, past family history, past medical history, past social history, past surgical history and problem list.  Review of Systems Pertinent items noted in HPI and remainder of comprehensive ROS otherwise negative.   Objective:    BP 105/71   Pulse 73   Temp 98.4 F (36.9 C)   Wt 150 lb 6.4 oz (68.2 kg)   LMP 09/09/2015 (Approximate) Comment: has been irreg since Depo  Breastfeeding? Yes Comment: pumping breast milk  BMI 27.51 kg/m   General:  alert, cooperative and no distress   Breasts:  inspection negative, no nipple discharge or bleeding, no masses or nodularity palpable  Lungs: clear to auscultation bilaterally  Heart:  regular rate and rhythm, S1, S2 normal, no murmur, click, rub or gallop  Abdomen: soft, non-tender; bowel sounds normal; no masses,  no organomegaly          50% of 15 min visit spent on counseling and coordination of care.  Assessment:     Normal 2 week postpartum exam. Pap smear not done at today's visit.  Plan:    1. Contraception: abstinence 2.  Depo injection given today 3. Follow up in: 4 weeks or as needed.  2hr  GTT for h/o GDM/screening for DM q 3 yrs per ADA recommendations Preconception counseling provided Healthy lifestyle practices reviewed

## 2016-07-01 ENCOUNTER — Other Ambulatory Visit: Payer: Self-pay | Admitting: Obstetrics

## 2016-07-08 ENCOUNTER — Encounter: Payer: Self-pay | Admitting: *Deleted

## 2016-07-08 ENCOUNTER — Ambulatory Visit (INDEPENDENT_AMBULATORY_CARE_PROVIDER_SITE_OTHER): Payer: Medicaid Other | Admitting: Certified Nurse Midwife

## 2016-07-08 ENCOUNTER — Encounter: Payer: Self-pay | Admitting: Certified Nurse Midwife

## 2016-07-08 VITALS — BP 100/63 | HR 83 | Temp 98.0°F | Wt 142.9 lb

## 2016-07-08 DIAGNOSIS — B3789 Other sites of candidiasis: Secondary | ICD-10-CM

## 2016-07-08 DIAGNOSIS — O9102 Infection of nipple associated with the puerperium: Secondary | ICD-10-CM | POA: Diagnosis not present

## 2016-07-08 MED ORDER — FLUCONAZOLE 150 MG PO TABS: 150.0000 mg | ORAL_TABLET | Freq: Once | ORAL | 0 refills | Status: AC

## 2016-07-08 MED ORDER — GENTIAN VIOLET 2 % EX SOLN: 0.5000 mL | Freq: Two times a day (BID) | CUTANEOUS | 0 refills | Status: DC

## 2016-07-08 NOTE — Progress Notes (Signed)
Subjective:     Christine Lambert is a 20 y.o. female who presents for a postpartum visit. She is 6 weeks postpartum following a spontaneous vaginal delivery. I have fully reviewed the prenatal and intrapartum course. The delivery was at 39.3 gestational weeks. Outcome: spontaneous vaginal delivery. Anesthesia: epidural. Postpartum course has been normal. Baby's course has been normal. Baby is feeding by breast. Bleeding staining only and brown. Bowel function is normal. Bladder function is normal. Patient is not sexually active. Contraception method is abstinence. Postpartum depression screening: negative.  Tobacco, alcohol and substance abuse history reviewed.  Adult immunizations reviewed including TDAP, rubella and varicella. Patient reports infant with thrush.  Patient states that she is having left nipple pain now that started yesterday.   The following portions of the patient's history were reviewed and updated as appropriate: allergies, current medications, past family history, past medical history, past social history, past surgical history and problem list.  Review of Systems Pertinent items noted in HPI and remainder of comprehensive ROS otherwise negative.   Objective:    BP 100/63   Pulse 83   Temp 98 F (36.7 C) (Oral)   Wt 142 lb 14.4 oz (64.8 kg)   LMP  (LMP Unknown)   Breastfeeding? Unknown Comment: Patient Pumping  BMI 26.14 kg/m   General:  alert, cooperative and no distress   Breasts:  inspection negative, no nipple discharge or bleeding, no masses or nodularity palpable  Lungs: clear to auscultation bilaterally  Heart:  regular rate and rhythm, S1, S2 normal, no murmur, click, rub or gallop  Abdomen: soft, non-tender; bowel sounds normal; no masses,  no organomegaly   Vulva:  normal  Vagina: normal vagina, no discharge, exudate, lesion, or erythema  Cervix:  no cervical motion tenderness  Corpus: normal size, contour, position, consistency, mobility, non-tender   Adnexa:  normal adnexa  Rectal Exam: Not performed.          50% of 15 min visit spent on counseling and coordination of care.  Assessment:     normal 6 week postpartum exam. Pap smear not done at today's visit.     Nipple candidiasis  Plan:    1. Contraception: Depo-Provera injections 2.  Lactation education given.  3. Follow up in: 1 year or as needed.  2hr GTT for h/o GDM/screening for DM q 3 yrs per ADA recommendations Preconception counseling provided Healthy lifestyle practices reviewed

## 2016-07-08 NOTE — Patient Instructions (Addendum)
Candidiasis and Breastfeeding The Candida organism is a fungus that lives in our bodies. It produces yeast cells and is kept at healthy levels by the natural bacteria in our bodies. Candida lives in warm, dark, and moist places of the body, such as skin folds under the breast and wet nipples covered by bras or nursing bra pads. When your body's natural balance of bacteria is upset, Candidacan overgrow, causing an infection. This type of infection is called candidiasis. WHAT ARE THE RISK FACTORS FOR DEVELOPING CANDIDIASIS IF I AM BREASTFEEDING? You may be at higher risk for developing candidiasis if you or your baby has been taking antibiotic medicines, your nipples are cracked, or you are taking oral contraceptives or steroids (such as for asthma). WHAT ARE THE SYMPTOMS OF CANDIDIASIS?  Severe stinging or burning pain, which may be on the surface of the nipples or may be felt deep inside the breast.   Pain during, in between, or especially right after feedings.   Sharp, shooting pain that spreads (radiates) from the nipple into the breast or into the back or arm.   Nipples suddenly become sore after the first two weeks after you give birth.   Sensitive nipples that may have pain with even a light touch. Nipples may also be:   Puffy.  Weepy.   Itchy.   Blistering.  Cracked.  Scaly.   Reddish.  Shiny.  Flaky.  HOW IS CANDIDIASIS DIAGNOSED WHEN I AM BREASTFEEDING? The diagnosis is often made based on the symptoms. Microscopic evaluation of breast discharge or cultures may be needed.  HOW IS CANDIDIASIS TREATED WHEN I AM BREASTFEEDING?  Yeast can be passed back and forth between a mother and her baby. The mother and baby may need treatment at the same time in order to clear up the infection, even if one does not have symptoms. Occasionally other family members (especially your sexual partner) may need to be treated at the same time. Treatment may involve:   Applying  antifungal cream to your nipples after each feeding.  Washing your nipples with warm water before nursing.   Stopping nursing from the affected breast and using a breast pump.   Keeping the affected breast empty of milk with nursing or with a breast pump.   Medicine. This may be given if your baby has thrush or diaper rash. If you are nursing and you have candidiasis, your baby should be treated for thrush even if you cannot see any white patches in the baby's mouth.   If your infection is more severe, you may be prescribed medicines by mouth.  Talk to your health care provider before starting treatment. It is important to begin treatment only after making sure other things are not the cause of the problem.  WHAT CAN I DO AT HOME? Usually after 24-48 hours, you should feel some improvement. In some cases, symptoms may get worse before they get better.   Only take medicines as directed by your health care provider. Make sure to finish all your medicines.   Only take over-the-counter or prescription medicines for pain, discomfort, or fever as directed by your health care provider.   Give your child medicines as directed by your health care provider. Make sure your child finishes them as directed by your health care provider.   Use creams or ointments as suggested by your health care provider.   Make sure your baby is seen and treated at the same time as you.   Wash your hands often.  Wash them before and after nursing and changing your baby's diaper and after using the bathroom. Use hot, soapy water. Use soft towels or cloths to pat yourself dry.  Wash your baby's hands often, especially if he or she sucks on his or her fingers.   If your baby uses a pacifier, it should be boiled for 20 minutes a day and replaced every week.  Nurse more often but for shorter periods of time. Start nursing on the least sore side.  Wash your breast pump and all its parts thoroughly in a bleach  solution. Boil all parts that touch the milk (except the rubber gaskets).   If nursing becomes too painful, you may want to pump your milk temporarily and feed it to your baby. Do not save or freeze this milk because if given to the baby after treatment is completed, it could cause the infection to return.  Eat yogurt that has live active cultures and take oral acidophilus.   Air dry your nipples after nursing.   Change bra pads after each feeding.   Wear 100% cotton bras and wash them every day in hot water.   Wash any towels or clothing that comes in contact with the infected area in very hot water (above 122F [50C]). WHEN SHOULD I SEEK MEDICAL CARE? Seek medical care if:  You or your baby are not getting better or are getting worse with the treatment.   Your breasts develop shooting pains, discomfort, itching, or burning after you take antibiotics.  WHEN SHOULD I SEEK IMMEDIATE MEDICAL CARE?  Seek immediate medical care if:  You have a fever or persistent symptoms for more than 2-3 days.   You have a fever and your symptoms suddenly get worse.   You develop swelling and severe pain in your breast.   You develop blisters on your breast.   You feel a lump in your breast, with or without pain.   Your nipple starts bleeding. MAKE SURE YOU:   Understand these instructions.  Will watch your condition.  Will get help right away if you are not doing well or get worse.   This information is not intended to replace advice given to you by your health care provider. Make sure you discuss any questions you have with your health care provider.   Document Released: 01/16/2005 Document Revised: 05/24/2013 Document Reviewed: 03/15/2013 Elsevier Interactive Patient Education 2016 [employer].  Breast Pumping Tips If you are breastfeeding, there may be times when you cannot feed your baby directly. Returning to work or going on a trip are common examples. Pumping  allows you to store breast milk and feed it to your baby later.  You may not get much milk when you first start to pump. Your breasts should start to make more after a few days. If you pump at the times you usually feed your baby, you may be able to keep making enough milk to feed your baby without also using formula. The more often you pump, the more milk you will produce. WHEN SHOULD I PUMP?   You can begin to pump soon after delivery. However, some experts recommend waiting about 4 weeks before giving your infant a bottle to make sure breastfeeding is going well.  If you plan to return to work, begin pumping a few weeks before. This will help you develop techniques that work best for you. It also lets you build up a supply of breast milk.   When you are with your  infant, feed on demand and pump after each feeding.   When you are away from your infant for several hours, pump for about 15 minutes every 2-3 hours. Pump both breasts at the same time if you can.   If your infant has a formula feeding, make sure to pump around the same time.   If you drink any alcohol, wait 2 hours before pumping.  HOW DO I PREPARE TO PUMP? Your let-down reflexis the natural reaction to stimulation that makes your breast milk flow. It is easier to stimulate this reflex when you are relaxed. Find relaxation techniques that work for you. If you have difficulty with your let-down reflex, try these methods:   Smell one of your infant's blankets or an item of clothing.   Look at a picture or video of your infant.   Sit in a quiet, private space.   Massage the breast you plan to pump.   Place soothing warmth on the breast.   Play relaxing music.  WHAT ARE SOME GENERAL BREAST PUMPING TIPS?  Wash your hands before you pump. You do not need to wash your nipples or breasts.  There are three ways to pump.  You can use your hand to massage and compress your breast.  You can use a handheld manual  pump.  You can use an electric pump.   Make sure the suction cup (flange) on the breast pump is the right size. Place the flange directly over the nipple. If it is the wrong size or placed the wrong way, it may be painful and cause nipple damage.   If pumping is uncomfortable, apply a small amount of purified or modified lanolin to your nipple and areola.  If you are using an electric pump, adjust the speed and suction power to be more comfortable.  If pumping is painful or if you are not getting very much milk, you may need a different type of pump. A lactation consultant can help you determine what type of pump to use.   Keep a full water bottle near you at all times. Drinking lots of fluid helps you make more milk.  You can store your milk to use later. Pumped breast milk can be stored in a sealable, sterile container or plastic bag. Label all stored breast milk with the date you pumped it.  Milk can stay out at room temperature for up to 8 hours.  You can store your milk in the refrigerator for up to 8 days.  You can store your milk in the freezer for 3 months. Thaw frozen milk using warm water. Do not put it in the microwave.  Do not smoke. Smoking can lower your milk supply and harm your infant. If you need help quitting, ask your health care provider to recommend a program.  WHEN SHOULD I CALL MY HEALTH CARE PROVIDER OR A LACTATION CONSULTANT?  You are having trouble pumping.  You are concerned that you are not making enough milk.  You have nipple pain, soreness, or redness.  You want to use birth control. Birth control pills may lower your milk supply. Talk to your health care provider about your options.   This information is not intended to replace advice given to you by your health care provider. Make sure you discuss any questions you have with your health care provider.   Document Released: 03/11/2010 Document Revised: 09/26/2013 Document Reviewed:  07/14/2013 Elsevier Interactive Patient Education [employer]2016 Elsevier Inc.

## 2016-07-22 ENCOUNTER — Other Ambulatory Visit: Payer: Self-pay | Admitting: Certified Nurse Midwife

## 2016-10-15 ENCOUNTER — Encounter (HOSPITAL_BASED_OUTPATIENT_CLINIC_OR_DEPARTMENT_OTHER): Payer: Self-pay | Admitting: *Deleted

## 2016-10-15 ENCOUNTER — Emergency Department (HOSPITAL_BASED_OUTPATIENT_CLINIC_OR_DEPARTMENT_OTHER)
Admission: EM | Admit: 2016-10-15 | Discharge: 2016-10-15 | Disposition: A | Discharge status: Home / Self Care | Payer: Medicaid Other | Attending: Emergency Medicine | Admitting: Emergency Medicine

## 2016-10-15 DIAGNOSIS — R102 Pelvic and perineal pain: Secondary | ICD-10-CM | POA: Diagnosis not present

## 2016-10-15 DIAGNOSIS — R110 Nausea: Secondary | ICD-10-CM | POA: Insufficient documentation

## 2016-10-15 DIAGNOSIS — Z87891 Personal history of nicotine dependence: Secondary | ICD-10-CM | POA: Diagnosis not present

## 2016-10-15 DIAGNOSIS — N898 Other specified noninflammatory disorders of vagina: Secondary | ICD-10-CM | POA: Diagnosis not present

## 2016-10-15 LAB — URINALYSIS, MICROSCOPIC (REFLEX)

## 2016-10-15 LAB — URINALYSIS, ROUTINE W REFLEX MICROSCOPIC
BILIRUBIN URINE: NEGATIVE
Glucose, UA: NEGATIVE mg/dL
Hgb urine dipstick: NEGATIVE
Ketones, ur: NEGATIVE mg/dL
NITRITE: NEGATIVE
PH: 6.5 (ref 5.0–8.0)
Protein, ur: NEGATIVE mg/dL
SPECIFIC GRAVITY, URINE: 1.03 (ref 1.005–1.030)

## 2016-10-15 LAB — WET PREP, GENITAL
Sperm: NONE SEEN
Trich, Wet Prep: NONE SEEN
Yeast Wet Prep HPF POC: NONE SEEN

## 2016-10-15 LAB — PREGNANCY, URINE: Preg Test, Ur: NEGATIVE

## 2016-10-15 MED ORDER — METRONIDAZOLE 0.75 % VA GEL: 1.0000 | Freq: Every day | VAGINAL | 0 refills | Status: DC

## 2016-10-15 NOTE — ED Provider Notes (Signed)
[facility] Note   CSN: [id number] Arrival date & time: 10/15/16  2057  By signing my name below, I, Christine Lambert, attest that this documentation has been prepared under the direction and in the presence of Demetrios LollKenneth Damico Partin, PA-C. Electronically Signed: Linna Darnerussell Lambert, Scribe. 10/15/2016. 10:12 PM.  History   Chief Complaint Chief Complaint  Patient presents with  . Vaginal Discharge    The history is provided by the patient. No language interpreter was used.     HPI Comments: Christine Lambert is a 21 y.o. female who presents to the Emergency Department complaining of persistent vaginal discharge beginning a few days ago. She states her discharge is "white, milky, and clumpy." She reports associated occasional, mild vaginal pain, intermittent vaginal itching, and some nausea. She gave birth on 05/26/16 and it was a normal vaginal delivery without complication; she notes she had white vaginal discharge during this pregnancy, but it was not "clumpy" like her current discharge. She is breast feeding. She is sexually active with one partner and does not use protection. Pt expresses concern for STD and wants to be tested today. She notes a h/o STD but cannot specify which one. She denies fever, chills, abdominal pain, vomiting, dysuria, hematuria, urinary urgency, urinary frequency, vaginal bleeding, or any other associated symptoms.  Past Medical History:  Diagnosis Date  . 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 Headache    migraines    Patient Active Problem List   Diagnosis Date Noted  . Active labor at term 05/26/2016  . PROM (premature rupture of membranes) 05/25/2016  . Supervision of normal first pregnancy in third trimester 04/28/2016    Past Surgical History:  Procedure Laterality Date  . NO PAST SURGERIES      OB History    Gravida Para Term Preterm AB Living   1 1 1  0 0 1   SAB TAB Ectopic Multiple Live Births   0 0 0    1       Home Medications    Prior to Admission medications   Medication Sig Start Date End Date Taking? Authorizing Provider  Prenatal Vit-Fe Phos-FA-Omega (VITAFOL GUMMIES) 3.33-0.333-34.8 MG CHEW CHEW THREE GUMMIES  BY MOUTH ONCE DAILY BEFORE BREAKFAST 07/23/16  Yes Brock Badharles A Harper, MD  benzocaine-Menthol (DERMOPLAST) 20-0.5 % AERO Apply 1 application topically as needed for irritation (perineal discomfort). Patient not taking: Reported on 07/08/2016 05/28/16   Roe Coombsachelle A Denney, CNM  cetirizine (ZYRTEC) 10 MG tablet Take 10 mg by mouth daily.    Historical Provider, MD  Cetirizine HCl (ZYRTEC ALLERGY) 10 MG CAPS Take 1 capsule (10 mg total) by mouth daily. Patient not taking: Reported on 07/08/2016 02/03/16   Brock Badharles A Harper, MD  coconut oil OIL Apply 1 application topically as needed. 05/28/16   Rachelle A Denney, CNM  gentian violet 2 % topical solution Apply 0.5 mLs topically 2 (two) times daily. For 3 days to nipples. 07/08/16   Rachelle A Denney, CNM  ibuprofen (ADVIL,MOTRIN) 600 MG tablet Take 1 tablet (600 mg total) by mouth every 6 (six) hours. Patient not taking: Reported on 07/08/2016 05/28/16   Rachelle A Denney, CNM  iron polysaccharides (NIFEREX) 150 MG capsule Take 1 capsule (150 mg total) by mouth 2 (two) times daily. 05/28/16   Rachelle A Denney, CNM  medroxyPROGESTERone (DEPO-PROVERA) 150 MG/ML injection Inject 1 mL (150 mg total) into the muscle every 3 (three) months. Bring to office at postpartum exam. 06/09/16  Rachelle A Denney, CNM  oxyCODONE-acetaminophen (PERCOCET/ROXICET) 5-325 MG tablet Take 1-2 tablets by mouth every 4 (four) hours as needed for severe pain. Patient not taking: Reported on 07/08/2016 05/28/16   Rachelle A Denney, CNM  senna-docusate (SENOKOT-S) 8.6-50 MG tablet Take 2 tablets by mouth daily. Patient not taking: Reported on 07/08/2016 05/28/16   Roe Coombs, CNM    Family History No family history on file.  Social History Social History    Substance Use Topics  . Smoking status: Former Smoker    Quit date: 08/18/2015  . Smokeless tobacco: Never Used  . Alcohol use No     Allergies   Patient has no known allergies.   Review of Systems Review of Systems  Constitutional: Negative for chills and fever.  Gastrointestinal: Positive for nausea. Negative for abdominal pain and vomiting.  Genitourinary: Positive for vaginal discharge and vaginal pain. Negative for dysuria, frequency, hematuria, urgency and vaginal bleeding.  All other systems reviewed and are negative.    Physical Exam Updated Vital Signs BP 108/75 (BP Location: Right Arm)   Pulse 60   Temp 98.2 F (36.8 C) (Oral)   Resp 18   Ht 5\' 1"  (1.549 m)   Wt 64.4 kg   SpO2 100%   BMI 26.83 kg/m   Physical Exam  Constitutional: She is oriented to person, place, and time. She appears well-developed and well-nourished. No distress.  HENT:  Head: Normocephalic and atraumatic.  Eyes: Conjunctivae and EOM are normal.  Neck: Normal range of motion. Neck supple. No tracheal deviation present.  Cardiovascular: Normal rate and regular rhythm.   Pulmonary/Chest: Effort normal. No respiratory distress.  Abdominal: Soft. Bowel sounds are normal. She exhibits no distension. There is no tenderness. There is no rebound and no guarding.  Genitourinary: There is no rash or tenderness on the right labia. There is no rash or tenderness on the left labia. Cervix exhibits no motion tenderness, no discharge and no friability. Right adnexum displays no mass, no tenderness and no fullness. Left adnexum displays no mass, no tenderness and no fullness. No erythema, tenderness or bleeding in the vagina. Vaginal discharge (white, thick, malodourous) found.  Genitourinary Comments: Chaperone present for exam  Musculoskeletal: Normal range of motion.  Neurological: She is alert and oriented to person, place, and time.  Skin: Skin is warm and dry. Capillary refill takes less than 2  seconds.  Psychiatric: She has a normal mood and affect. Her behavior is normal.  Nursing note and vitals reviewed.    ED Treatments / Results  Labs (all labs ordered are listed, but only abnormal results are displayed) Labs Reviewed  WET PREP, GENITAL - Abnormal; Notable for the following:       Result Value   Clue Cells Wet Prep HPF POC PRESENT (*)    WBC, Wet Prep HPF POC MANY (*)    All other components within normal limits  URINALYSIS, ROUTINE W REFLEX MICROSCOPIC - Abnormal; Notable for the following:    APPearance CLOUDY (*)    Leukocytes, UA SMALL (*)    All other components within normal limits  URINALYSIS, MICROSCOPIC (REFLEX) - Abnormal; Notable for the following:    Bacteria, UA MANY (*)    Squamous Epithelial / LPF 6-30 (*)    All other components within normal limits  PREGNANCY, URINE  RPR  HIV ANTIBODY (ROUTINE TESTING)  GC/CHLAMYDIA PROBE AMP ([place]) NOT AT [facility]    EKG  EKG Interpretation None  Radiology No results found.  Procedures Procedures (including critical care time)  DIAGNOSTIC STUDIES: Oxygen Saturation is 100% on RA, normal by my interpretation.    COORDINATION OF CARE: 10:19 PM Discussed treatment plan with pt at bedside and pt agreed to plan.  Medications Ordered in ED Medications - No data to display   Initial Impression / Assessment and Plan / ED Course  I have reviewed the triage vital signs and the nursing notes.  Pertinent labs & imaging results that were available during my care of the patient were reviewed by me and considered in my medical decision making (see chart for details).  Clinical Course   Patient presents to the ED with vaginal discharge 3 days. She also endorses pruritus and discomfort. Urine with many epithelial cells, bacteria, leukocytes. Patient denies any urinary symptoms. Likely asymptomatic bacteriuria. Will not treat at this time as patient does not have any symptoms of UTI. Pelvic exam  reveals thick white discharge that is odorous. Wet prep shows clue cells and WBCs. No yeast or present. Gonorrhea and chlamydia cultures are pending. Patient requested HIV and syphilis testing as well which are pending. Given patient's discharge we'll treat for bacterial vaginosis. Encourage patient to use Monistat. Patient is breast-feeding. Discussed with Dr. Particia Nearing and recommends Flagyl gel. Informed patient that we will call with results of cultures. Will not treat for STD at this time given patient is breast-feeding. If cultures are positive patient needs to be treated accordingly. I encouraged patient to follow up with her OB/GYN doctor. She states she will get in contact with them next week. Given strict return precautions including urinary symptoms, abdominal pain, fever, worsening discharge, vaginal bleeding. Patient denies any pain prior to discharge. Pt is hemodynamically stable, in NAD, & able to ambulate in the ED. Pain has been managed & has no complaints prior to dc. Pt is comfortable with above plan and is stable for discharge at this time. All questions were answered prior to disposition. Strict return precautions for f/u to the ED were discussed.]   Final Clinical Impressions(s) / ED Diagnoses   Final diagnoses:  Vaginal discharge  Vaginal itching    New Prescriptions Discharge Medication List as of 10/15/2016 11:26 PM    START taking these medications   Details  metroNIDAZOLE (METROGEL) 0.75 % vaginal gel Place 1 Applicatorful vaginally daily. One applicator vaginally daily for 5 days., Starting Thu 10/15/2016, Print       I personally performed the services described in this documentation, which was scribed in my presence. The recorded information has been reviewed and is accurate.    Rise Mu, PA-C 10/16/16 0005    Jacalyn Lefevre, MD 10/20/16 [phone]

## 2016-10-15 NOTE — Discharge Instructions (Signed)
Please use the flagyl get as directed for 5 days. Please follow up with your ob/gyn if your symptoms are not improving. Return to the ED if you develop worsening symptoms, including worsening abdominal pain, urinary symptoms, fevers, worsening discharge or bleeding.

## 2016-10-15 NOTE — ED Triage Notes (Signed)
Vaginal d/c and nausea.

## 2016-10-16 LAB — GC/CHLAMYDIA PROBE AMP (~~LOC~~) NOT AT ARMC
CHLAMYDIA, DNA PROBE: NEGATIVE
NEISSERIA GONORRHEA: NEGATIVE

## 2016-10-17 LAB — HIV ANTIBODY (ROUTINE TESTING W REFLEX): HIV SCREEN 4TH GENERATION: NONREACTIVE

## 2016-10-17 LAB — RPR: RPR: NONREACTIVE

## 2016-10-19 ENCOUNTER — Ambulatory Visit: Payer: Medicaid Other

## 2016-10-19 DIAGNOSIS — Z3009 Encounter for other general counseling and advice on contraception: Secondary | ICD-10-CM

## 2016-10-19 NOTE — Progress Notes (Signed)
Pt presents for nurse visit for Depo. She did not bring the shot with her. Pt to rto with injection.

## 2016-10-20 ENCOUNTER — Ambulatory Visit (INDEPENDENT_AMBULATORY_CARE_PROVIDER_SITE_OTHER): Payer: Medicaid Other

## 2016-10-20 DIAGNOSIS — Z3042 Encounter for surveillance of injectable contraceptive: Secondary | ICD-10-CM

## 2016-10-20 MED ORDER — MEDROXYPROGESTERONE ACETATE 150 MG/ML IM SUSP
150.0000 mg | Freq: Once | INTRAMUSCULAR | Status: AC
  Administered 2016-10-20: 150 mg via INTRAMUSCULAR

## 2016-10-20 NOTE — Progress Notes (Signed)
Pt present for Depo injection today. UPT neg.

## 2016-12-31 ENCOUNTER — Telehealth: Payer: Self-pay | Admitting: *Deleted

## 2016-12-31 NOTE — Telephone Encounter (Signed)
Patient states she was doing well with her Depo- then for the last month she has been bleeding. She has 1 week of heavy bleeding which taper and now it has started again for another week. She is concerned about her weight loss and lack of appetite. She is no longer breast feeding. She is due to come in 4/9 for her next shot. Advised patient anything is fair game as far as bleeding goes with the Depo the first 2 shots. She can come early to get her Depo if she would like. Advised increased fluids and continue her vitamins. She is going to check with her pharmacy and call us back.

## 2017-01-05 ENCOUNTER — Telehealth: Payer: Self-pay | Admitting: *Deleted

## 2017-01-05 NOTE — Telephone Encounter (Signed)
Pt had called to office last week regarding depo.  Pt states she was told to pick up depo and she may be able to schedule earlier due to some bleeding problems.   Attempt to contact pt.  LM on VM making pt aware that she may call office and have appt moved up. Pt made aware the earliest appt could be made would be tomorrow, 01/05/17.

## 2017-01-11 ENCOUNTER — Ambulatory Visit (INDEPENDENT_AMBULATORY_CARE_PROVIDER_SITE_OTHER): Payer: Medicaid Other

## 2017-01-11 VITALS — BP 94/64 | HR 83 | Wt 128.3 lb

## 2017-01-11 DIAGNOSIS — Z3042 Encounter for surveillance of injectable contraceptive: Secondary | ICD-10-CM | POA: Diagnosis not present

## 2017-01-11 NOTE — Progress Notes (Signed)
Patient is in the office for depo injection, administered and pt tolerated well .Marland Kitchen Administrations This Visit    medroxyPROGESTERone (DEPO-PROVERA) injection 150 mg    Admin Date 01/11/2017 Action Given Dose 150 mg Route Intramuscular Administered By Katrina Stack, RN

## 2017-04-05 ENCOUNTER — Ambulatory Visit: Payer: Medicaid Other

## 2017-10-04 ENCOUNTER — Inpatient Hospital Stay (HOSPITAL_COMMUNITY): Payer: Self-pay

## 2017-10-04 ENCOUNTER — Inpatient Hospital Stay (HOSPITAL_COMMUNITY)
Admission: AD | Admit: 2017-10-04 | Discharge: 2017-10-04 | Disposition: A | Discharge status: Home / Self Care | Payer: Self-pay | Source: Ambulatory Visit | Attending: Obstetrics & Gynecology | Admitting: Obstetrics & Gynecology

## 2017-10-04 ENCOUNTER — Encounter (HOSPITAL_COMMUNITY): Payer: Self-pay | Admitting: *Deleted

## 2017-10-04 DIAGNOSIS — O26899 Other specified pregnancy related conditions, unspecified trimester: Secondary | ICD-10-CM

## 2017-10-04 DIAGNOSIS — O26891 Other specified pregnancy related conditions, first trimester: Secondary | ICD-10-CM | POA: Insufficient documentation

## 2017-10-04 DIAGNOSIS — Z87891 Personal history of nicotine dependence: Secondary | ICD-10-CM | POA: Insufficient documentation

## 2017-10-04 DIAGNOSIS — L4054 Psoriatic juvenile arthropathy: Secondary | ICD-10-CM | POA: Insufficient documentation

## 2017-10-04 DIAGNOSIS — Z79899 Other long term (current) drug therapy: Secondary | ICD-10-CM | POA: Insufficient documentation

## 2017-10-04 DIAGNOSIS — O3680X0 Pregnancy with inconclusive fetal viability, not applicable or unspecified: Secondary | ICD-10-CM

## 2017-10-04 DIAGNOSIS — Z3A01 Less than 8 weeks gestation of pregnancy: Secondary | ICD-10-CM | POA: Insufficient documentation

## 2017-10-04 DIAGNOSIS — R109 Unspecified abdominal pain: Secondary | ICD-10-CM | POA: Insufficient documentation

## 2017-10-04 LAB — WET PREP, GENITAL
CLUE CELLS WET PREP: NONE SEEN
Sperm: NONE SEEN
Trich, Wet Prep: NONE SEEN
Yeast Wet Prep HPF POC: NONE SEEN

## 2017-10-04 LAB — URINALYSIS, ROUTINE W REFLEX MICROSCOPIC
BILIRUBIN URINE: NEGATIVE
Bacteria, UA: NONE SEEN
GLUCOSE, UA: NEGATIVE mg/dL
HGB URINE DIPSTICK: NEGATIVE
KETONES UR: 20 mg/dL — AB
Nitrite: NEGATIVE
PH: 6 (ref 5.0–8.0)
Protein, ur: 30 mg/dL — AB
Specific Gravity, Urine: 1.027 (ref 1.005–1.030)

## 2017-10-04 LAB — CBC
HEMATOCRIT: 37.9 % (ref 36.0–46.0)
Hemoglobin: 12.6 g/dL (ref 12.0–15.0)
MCH: 29.5 pg (ref 26.0–34.0)
MCHC: 33.2 g/dL (ref 30.0–36.0)
MCV: 88.8 fL (ref 78.0–100.0)
Platelets: 405 10*3/uL — ABNORMAL HIGH (ref 150–400)
RBC: 4.27 MIL/uL (ref 3.87–5.11)
RDW: 14.1 % (ref 11.5–15.5)
WBC: 10 10*3/uL (ref 4.0–10.5)

## 2017-10-04 LAB — HCG, QUANTITATIVE, PREGNANCY: hCG, Beta Chain, Quant, S: 5594 m[IU]/mL — ABNORMAL HIGH (ref <5)

## 2017-10-04 LAB — POCT PREGNANCY, URINE: Preg Test, Ur: POSITIVE — AB

## 2017-10-04 NOTE — Discharge Instructions (Signed)

## 2017-10-04 NOTE — MAU Note (Signed)
Pt needs pregnancy verification. C/o mild cramping that started today. denies any vaginal bleeding

## 2017-10-04 NOTE — MAU Provider Note (Signed)
Chief Complaint: Abdominal Pain   First Provider Initiated Contact with Patient 10/04/17 1621     SUBJECTIVE HPI: Christine Lambert is a 21 y.o. G2P1001 at [redacted]w[redacted]d who presents to Maternity Admissions reporting: Low abd pain  Vaginal Bleeding: Denies Passage of tissue or clots: denies Dizziness: Denies  B POS  Pain Location: suprapubic Quality: cramping Severity: 3/10 on pain scale Duration: few days Course: waxing and waning Context: Early pregnancy Timing: intermittent Modifying factors: None. None. Hasn't tried anything for the pain.  Associated signs and symptoms: Neg for fever, chills, urinary complaints, GI complaints, VB.   Past Medical History:  Diagnosis Date  . 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 Headache    migraines   OB History  Gravida Para Term Preterm AB Living  2 1 1  0 0 1  SAB TAB Ectopic Multiple Live Births  0 0 0   1    # Outcome Date GA Lbr Len/2nd Weight Sex Delivery Anes PTL Lv  2 Current           1 Term 05/26/16 [redacted]w[redacted]d 13:33 / 07:20 7 lb 8.8 oz (3.425 kg) F Vag-Spont EPI, Local  LIV     Birth Comments: caput     Past Surgical History:  Procedure Laterality Date  . NO PAST SURGERIES     Social History   Socioeconomic History  . Marital status: Single    Spouse name: Not on file  . Number of children: Not on file  . Years of education: Not on file  . Highest education level: Not on file  Social Needs  . Financial resource strain: Not on file  . Food insecurity - worry: Not on file  . Food insecurity - inability: Not on file  . Transportation needs - medical: Not on file  . Transportation needs - non-medical: Not on file  Occupational History  . Not on file  Tobacco Use  . Smoking status: Former Smoker    Last attempt to quit: 08/18/2015    Years since quitting: 2.1  . Smokeless tobacco: Never Used  Substance and Sexual Activity  . Alcohol use: No  . Drug use: No  . Sexual activity: Yes     Partners: Female, Female    Birth control/protection: None  Other Topics Concern  . Not on file  Social History Narrative   ** Merged History Encounter **       No current facility-administered medications on file prior to encounter.    Current Outpatient Medications on File Prior to Encounter  Medication Sig Dispense Refill  . benzocaine-Menthol (DERMOPLAST) 20-0.5 % AERO Apply 1 application topically as needed for irritation (perineal discomfort). (Patient not taking: Reported on 07/08/2016) 78 g 0  . cetirizine (ZYRTEC) 10 MG tablet Take 10 mg by mouth daily.    . Cetirizine HCl (ZYRTEC ALLERGY) 10 MG CAPS Take 1 capsule (10 mg total) by mouth daily. (Patient not taking: Reported on 07/08/2016) 30 capsule 11  . coconut oil OIL Apply 1 application topically as needed. 500 mL PRN  . gentian violet 2 % topical solution Apply 0.5 mLs topically 2 (two) times daily. For 3 days to nipples. 30 mL 0  . ibuprofen (ADVIL,MOTRIN) 600 MG tablet Take 1 tablet (600 mg total) by mouth every 6 (six) hours. (Patient not taking: Reported on 07/08/2016) 120 tablet 2  . iron polysaccharides (NIFEREX) 150 MG capsule Take 1 capsule (150 mg total) by mouth 2 (two) times daily. 60 capsule  4  . medroxyPROGESTERone (DEPO-PROVERA) 150 MG/ML injection Inject 1 mL (150 mg total) into the muscle every 3 (three) months. Bring to office at postpartum exam. 1 mL 4  . metroNIDAZOLE (METROGEL) 0.75 % vaginal gel Place 1 Applicatorful vaginally daily. One applicator vaginally daily for 5 days. 70 g 0  . oxyCODONE-acetaminophen (PERCOCET/ROXICET) 5-325 MG tablet Take 1-2 tablets by mouth every 4 (four) hours as needed for severe pain. (Patient not taking: Reported on 07/08/2016) 45 tablet 0  . Prenatal Vit-Fe Phos-FA-Omega (VITAFOL GUMMIES) 3.33-0.333-34.8 MG CHEW CHEW THREE GUMMIES  BY MOUTH ONCE DAILY BEFORE BREAKFAST 90 tablet 0  . senna-docusate (SENOKOT-S) 8.6-50 MG tablet Take 2 tablets by mouth daily. (Patient not taking:  Reported on 07/08/2016) 90 tablet 2   Allergies  Allergen Reactions  . Nickel     I have reviewed the past Medical Hx, Surgical Hx, Social Hx, Allergies and Medications.   Review of Systems  Constitutional: Negative for chills and fever.  Gastrointestinal: Positive for abdominal pain. Negative for constipation, diarrhea, nausea and vomiting.  Genitourinary: Negative for dysuria, flank pain, vaginal bleeding and vaginal discharge.    OBJECTIVE Patient Vitals for the past 24 hrs:  BP Temp Pulse Resp Height Weight  10/04/17 1357 112/64 98.6 F (37 C) 72 18 5\' 1"  (1.549 m) 110 lb (49.9 kg)   Constitutional: Well-developed, well-nourished female in no acute distress.  Cardiovascular: normal rate Respiratory: normal rate and effort.  GI: Abd soft, non-tender.  MS: Extremities nontender, no edema, normal ROM Neurologic: Alert and oriented x 4.  GU: Neg CVAT.  PELVIC EXAM: NEFG, physiologic discharge, no blood noted, cervix closed; uterus top-normal size, no adnexal tenderness or masses. No CMT.  LAB RESULTS Results for orders placed or performed during the hospital encounter of 10/04/17 (from the past 24 hour(s))  Urinalysis, Routine w reflex microscopic     Status: Abnormal   Collection Time: 10/04/17  2:00 PM  Result Value Ref Range   Color, Urine YELLOW YELLOW   APPearance HAZY (A) CLEAR   Specific Gravity, Urine 1.027 1.005 - 1.030   pH 6.0 5.0 - 8.0   Glucose, UA NEGATIVE NEGATIVE mg/dL   Hgb urine dipstick NEGATIVE NEGATIVE   Bilirubin Urine NEGATIVE NEGATIVE   Ketones, ur 20 (A) NEGATIVE mg/dL   Protein, ur 30 (A) NEGATIVE mg/dL   Nitrite NEGATIVE NEGATIVE   Leukocytes, UA SMALL (A) NEGATIVE   RBC / HPF 0-5 0 - 5 RBC/hpf   WBC, UA 0-5 0 - 5 WBC/hpf   Bacteria, UA NONE SEEN NONE SEEN   Squamous Epithelial / LPF 6-30 (A) NONE SEEN   Mucus PRESENT   Pregnancy, urine POC     Status: Abnormal   Collection Time: 10/04/17  2:04 PM  Result Value Ref Range   Preg Test, Ur  POSITIVE (A) NEGATIVE  hCG, quantitative, pregnancy     Status: Abnormal   Collection Time: 10/04/17  3:59 PM  Result Value Ref Range   hCG, Beta Chain, Quant, S 5,594 (H) <5 mIU/mL  CBC     Status: Abnormal   Collection Time: 10/04/17  3:59 PM  Result Value Ref Range   WBC 10.0 4.0 - 10.5 K/uL   RBC 4.27 3.87 - 5.11 MIL/uL   Hemoglobin 12.6 12.0 - 15.0 g/dL   HCT 11.937.9 14.736.0 - 82.946.0 %   MCV 88.8 78.0 - 100.0 fL   MCH 29.5 26.0 - 34.0 pg   MCHC 33.2 30.0 - 36.0 g/dL  RDW 14.1 11.5 - 15.5 %   Platelets 405 (H) 150 - 400 K/uL  Wet prep, genital     Status: Abnormal   Collection Time: 10/04/17  5:31 PM  Result Value Ref Range   Yeast Wet Prep HPF POC NONE SEEN NONE SEEN   Trich, Wet Prep NONE SEEN NONE SEEN   Clue Cells Wet Prep HPF POC NONE SEEN NONE SEEN   WBC, Wet Prep HPF POC MANY (A) NONE SEEN   Sperm NONE SEEN     IMAGING US Ob Comp Less 14 Wks  Result Date: 10/04/2017 CLINICAL DATA:  Pregnant patient with lower abdominal cramping. EXAM: OBSTETRIC <14 WK [place] AND TRANSVAGINAL OB US TECHNIQUE: Both transabdominal and transvaginal ultrasound examinations were performed for complete evaluation of the gestation as well as the maternal uterus, adnexal regions, and pelvic cul-de-sac. Transvaginal technique was performed to assess early pregnancy. COMPARISON:  None. FINDINGS: Intrauterine gestational sac: Single Yolk sac:  Not Visualized. Embryo:  Not Visualized. Cardiac Activity: Not Visualized. Heart Rate: 0  bpm MSD: 5.3  mm   5 w   2  d Subchorionic hemorrhage:  None visualized. Maternal uterus/adnexae: Corpus luteum right ovary. Normal left ovary. Trace free fluid in the pelvis. IMPRESSION: Probable early intrauterine gestational sac, but no yolk sac, fetal pole, or cardiac activity yet visualized. Recommend follow-up quantitative B-HCG levels and follow-up US in 14 days to assess viability. This recommendation follows SRU consensus guidelines: Diagnostic Criteria for Nonviable Pregnancy  Early in the First Trimester. Malva Limes Med 2013; 960:4540-98. Electronically Signed   By: Annia Belt M.D.   On: 10/04/2017 17:03   US Ob Transvaginal  Result Date: 10/04/2017 CLINICAL DATA:  Pregnant patient with lower abdominal cramping. EXAM: OBSTETRIC <14 WK [place] AND TRANSVAGINAL OB US TECHNIQUE: Both transabdominal and transvaginal ultrasound examinations were performed for complete evaluation of the gestation as well as the maternal uterus, adnexal regions, and pelvic cul-de-sac. Transvaginal technique was performed to assess early pregnancy. COMPARISON:  None. FINDINGS: Intrauterine gestational sac: Single Yolk sac:  Not Visualized. Embryo:  Not Visualized. Cardiac Activity: Not Visualized. Heart Rate: 0  bpm MSD: 5.3  mm   5 w   2  d Subchorionic hemorrhage:  None visualized. Maternal uterus/adnexae: Corpus luteum right ovary. Normal left ovary. Trace free fluid in the pelvis. IMPRESSION: Probable early intrauterine gestational sac, but no yolk sac, fetal pole, or cardiac activity yet visualized. Recommend follow-up quantitative B-HCG levels and follow-up US in 14 days to assess viability. This recommendation follows SRU consensus guidelines: Diagnostic Criteria for Nonviable Pregnancy Early in the First Trimester. Malva Limes Med 2013; 119:1478-29. Electronically Signed   By: Annia Belt M.D.   On: 10/04/2017 17:03    MAU COURSE CBC, Quant, ABO/Rh, ultrasound, wet prep and GC/chlamydia culture, UA  MDM Abd pain in early pregnancy with pregnancy of unknown anatomic location, but hemodynamically stable. F/U quant in 48 hours. Unable to come during office hours so will return to MAU. Ectopic precautions   ASSESSMENT 1. Pregnancy of unknown anatomic location   2. Abdominal pain affecting pregnancy     PLAN Discharge home in stable condition. SAB/Ectopic  precautions Follow-up Information    [facility] ADMISS[facility] Follow up on 10/04/2017.   Why:  evening for  repeat bloodwork or as needed in emergencies Contact information: [address] [id number] mc [place] [address] [phone]         Allergies as of 10/04/2017  Reactions   Nickel       Medication List    STOP taking these medications   benzocaine-Menthol 20-0.5 % Aero Commonly known as:  DERMOPLAST   cetirizine 10 MG tablet Commonly known as:  ZYRTEC   Cetirizine HCl 10 MG Caps Commonly known as:  ZYRTEC ALLERGY   coconut oil Oil   gentian violet 2 % topical solution   ibuprofen 600 MG tablet Commonly known as:  ADVIL,MOTRIN   iron polysaccharides 150 MG capsule Commonly known as:  NIFEREX   medroxyPROGESTERone 150 MG/ML injection Commonly known as:  DEPO-PROVERA   metroNIDAZOLE 0.75 % vaginal gel Commonly known as:  METROGEL   oxyCODONE-acetaminophen 5-325 MG tablet Commonly known as:  PERCOCET/ROXICET   senna-docusate 8.6-50 MG tablet Commonly known as:  Senokot-S     TAKE these medications   VITAFOL GUMMIES 3.33-0.333-34.8 MG Chew CHEW THREE GUMMIES  BY MOUTH ONCE DAILY BEFORE EthelBREAKFAST        Xzavion Doswell, [place], [place] 10/04/2017  6:16 PM  4

## 2017-10-05 LAB — HIV ANTIBODY (ROUTINE TESTING W REFLEX): HIV Screen 4th Generation wRfx: NONREACTIVE

## 2017-10-05 NOTE — L&D Delivery Note (Signed)
Delivery Note At  a viable female was delivered via  (Presentation:vertex ; LOA ).  APGAR:9 ,9 ; weight  .   Placenta status:spont ,shultz .  Cord:3vc  with the following complications: none.  Cord pH: n/a  Anesthesia:  none Episiotomy:  none Lacerations:  none Suture Repair: n/a Est. Blood Loss 50(mL):    Mom to postpartum.  Baby to Couplet care / Skin to Skin.  Christine Lambert 06/05/2018, 3:50 PM

## 2017-10-06 LAB — GC/CHLAMYDIA PROBE AMP (~~LOC~~) NOT AT ARMC
CHLAMYDIA, DNA PROBE: POSITIVE — AB
Neisseria Gonorrhea: NEGATIVE

## 2017-10-11 ENCOUNTER — Telehealth: Payer: Self-pay | Admitting: Medical

## 2017-10-11 DIAGNOSIS — A749 Chlamydial infection, unspecified: Secondary | ICD-10-CM

## 2017-10-11 MED ORDER — AZITHROMYCIN 250 MG PO TABS: 1000.0000 mg | ORAL_TABLET | Freq: Once | ORAL | 0 refills | Status: AC

## 2017-10-11 NOTE — Telephone Encounter (Addendum)
Christine ModenaMichaela Lambert tested positive for  Chlamydia. Patient was called by RN and allergies and pharmacy confirmed. Rx sent to pharmacy of choice.   [name], [name], PA-C 10/11/2017 3:03 PM      ----- Message from Kathe BectonLori S Berdik, RN sent at 10/11/2017  2:36 PM EST ----- This patient tested positive for:  chlamydia  She:"has NKDA", I have informed the patient of her results and confirmed her pharmacy is correct in her chart. Please send Rx.   Thank you,   Kathe BectonBerdik, Lori S, RN   [facility]

## 2017-10-12 ENCOUNTER — Inpatient Hospital Stay (HOSPITAL_COMMUNITY)
Admission: AD | Admit: 2017-10-12 | Discharge: 2017-10-12 | Disposition: A | Discharge status: Home / Self Care | Payer: Self-pay | Source: Ambulatory Visit | Attending: Obstetrics and Gynecology | Admitting: Obstetrics and Gynecology

## 2017-10-12 ENCOUNTER — Inpatient Hospital Stay (HOSPITAL_COMMUNITY): Payer: Self-pay

## 2017-10-12 ENCOUNTER — Other Ambulatory Visit: Payer: Self-pay

## 2017-10-12 DIAGNOSIS — O3680X0 Pregnancy with inconclusive fetal viability, not applicable or unspecified: Secondary | ICD-10-CM | POA: Insufficient documentation

## 2017-10-12 DIAGNOSIS — Z3491 Encounter for supervision of normal pregnancy, unspecified, first trimester: Secondary | ICD-10-CM

## 2017-10-12 DIAGNOSIS — Z3A01 Less than 8 weeks gestation of pregnancy: Secondary | ICD-10-CM | POA: Insufficient documentation

## 2017-10-12 LAB — URINALYSIS, ROUTINE W REFLEX MICROSCOPIC
Bilirubin Urine: NEGATIVE
Glucose, UA: NEGATIVE mg/dL
Hgb urine dipstick: NEGATIVE
Ketones, ur: 5 mg/dL — AB
NITRITE: NEGATIVE
PROTEIN: NEGATIVE mg/dL
SPECIFIC GRAVITY, URINE: 1.026 (ref 1.005–1.030)
pH: 6 (ref 5.0–8.0)

## 2017-10-12 LAB — HCG, QUANTITATIVE, PREGNANCY: HCG, BETA CHAIN, QUANT, S: 18672 m[IU]/mL — AB (ref <5)

## 2017-10-12 NOTE — MAU Provider Note (Signed)
History     CSN: [id number]  Arrival date and time: 10/12/17 1527   First Provider Initiated Contact with Patient 10/12/17 1800      Chief Complaint  Patient presents with  . Follow Up Testing   HPI  Ms.  Christine Lambert is a 22 y.o. year old 612P1001 female at 5888w4d weeks gestation who presents to MAU for repeat HCG. She was seen on 12/31 and had a pregnancy of unknown location with a HCG level of 5992. She was instructed to return to MAU for repeat HCG in 48 hrs.  She was unable to return then. She is here tonight to have her HCG level drawn. She reports feeling much better since she was seen here on 12/31. She reports breast tenderness and mild nausea.  She states, "I just feel like I have a normal pregnancy now."  Past Medical History:  Diagnosis Date  . 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 Headache    migraines    Past Surgical History:  Procedure Laterality Date  . NO PAST SURGERIES      No family history on file.  Social History   Tobacco Use  . Smoking status: Former Smoker    Last attempt to quit: 08/18/2015    Years since quitting: 2.1  . Smokeless tobacco: Never Used  Substance Use Topics  . Alcohol use: No  . Drug use: No    Allergies:  Allergies  Allergen Reactions  . Nickel     Medications Prior to Admission  Medication Sig Dispense Refill Last Dose  . Prenatal Vit-Fe Phos-FA-Omega (VITAFOL GUMMIES) 3.33-0.333-34.8 MG CHEW CHEW THREE GUMMIES  BY MOUTH ONCE DAILY BEFORE BREAKFAST 90 tablet 0     Review of Systems  Constitutional: Negative.   HENT: Negative.   Eyes: Negative.   Respiratory: Negative.   Cardiovascular: Negative.   Gastrointestinal: Negative.   Endocrine: Negative.   Genitourinary: Negative.   Musculoskeletal: Negative.   Skin: Negative.   Allergic/Immunologic: Negative.   Neurological: Negative.   Hematological: Negative.   Psychiatric/Behavioral: Negative.    Physical Exam   Blood  pressure (!) 102/53, pulse 97, temperature 99.5 F (37.5 C), temperature source Oral, resp. rate 20, height 5\' 1"  (1.549 m), weight 112 lb (50.8 kg), last menstrual period 08/20/2017, SpO2 100 %, unknown if currently breastfeeding.  Physical Exam  Constitutional: She is oriented to person, place, and time. She appears well-developed and well-nourished.  Musculoskeletal: Normal range of motion.  Neurological: She is alert and oriented to person, place, and time.  Psychiatric: She has a normal mood and affect. Her behavior is normal. Judgment and thought content normal.    MAU Course  Procedures  MDM CCUA HCG OB TVUS <14 wks  Results for orders placed or performed during the hospital encounter of 10/12/17 (from the past 24 hour(s))  Urinalysis, Routine w reflex microscopic     Status: Abnormal   Collection Time: 10/12/17  4:08 PM  Result Value Ref Range   Color, Urine YELLOW YELLOW   APPearance HAZY (A) CLEAR   Specific Gravity, Urine 1.026 1.005 - 1.030   pH 6.0 5.0 - 8.0   Glucose, UA NEGATIVE NEGATIVE mg/dL   Hgb urine dipstick NEGATIVE NEGATIVE   Bilirubin Urine NEGATIVE NEGATIVE   Ketones, ur 5 (A) NEGATIVE mg/dL   Protein, ur NEGATIVE NEGATIVE mg/dL   Nitrite NEGATIVE NEGATIVE   Leukocytes, UA MODERATE (A) NEGATIVE   RBC / HPF 0-5 0 - 5 RBC/hpf  WBC, UA 0-5 0 - 5 WBC/hpf   Bacteria, UA RARE (A) NONE SEEN   Squamous Epithelial / LPF 0-5 (A) NONE SEEN   Mucus PRESENT   hCG, quantitative, pregnancy     Status: Abnormal   Collection Time: 10/12/17  4:14 PM  Result Value Ref Range   hCG, Beta Chain, Quant, S 18,672 (H) <5 mIU/mL    Report given to and care assumed by [facility], DO @ [address], MSN, CNM 10/12/2017, 6:00 PM   Assessment and Plan  1. SIUP  Discharge in stable condition Establish prenatal care instructions given Handout provided  Caryl Ada, DO OB Fellow

## 2017-10-12 NOTE — MAU Note (Signed)
Pt presents for f/u visit.  Reports was seen on January 1 for pregnancy verification form & c/o right sided lower abdominal pain.  States +UPT.  U/S done, unable to visualize pregnancy.  Pt instructed to return last Thursday for f/u HCG & U/S, didn't return as instructed.   Request f/u visit today.

## 2017-10-12 NOTE — Discharge Instructions (Signed)
Establish prenatal care   First Trimester of Pregnancy The first trimester of pregnancy is from week 1 until the end of week 13 (months 1 through 3). During this time, your baby will begin to develop inside you. At 6-8 weeks, the eyes and face are formed, and the heartbeat can be seen on ultrasound. At the end of 12 weeks, all the baby's organs are formed. Prenatal care is all the medical care you receive before the birth of your baby. Make sure you get good prenatal care and follow all of your doctor's instructions. Follow these instructions at home: Medicines  Take over-the-counter and prescription medicines only as told by your doctor. Some medicines are safe and some medicines are not safe during pregnancy.  Take a prenatal vitamin that contains at least 600 micrograms (mcg) of folic acid.  If you have trouble pooping (constipation), take medicine that will make your stool soft (stool softener) if your doctor approves. Eating and drinking  Eat regular, healthy meals.  Your doctor will tell you the amount of weight gain that is right for you.  Avoid raw meat and uncooked cheese.  If you feel sick to your stomach (nauseous) or throw up (vomit): ? Eat 4 or 5 small meals a day instead of 3 large meals. ? Try eating a few soda crackers. ? Drink liquids between meals instead of during meals.  To prevent constipation: ? Eat foods that are high in fiber, like fresh fruits and vegetables, whole grains, and beans. ? Drink enough fluids to keep your pee (urine) clear or pale yellow. Activity  Exercise only as told by your doctor. Stop exercising if you have cramps or pain in your lower belly (abdomen) or low back.  Do not exercise if it is too hot, too humid, or if you are in a place of great height (high altitude).  Try to avoid standing for long periods of time. Move your legs often if you must stand in one place for a long time.  Avoid heavy lifting.  Wear low-heeled shoes. Sit and  stand up straight.  You can have sex unless your doctor tells you not to. Relieving pain and discomfort  Wear a good support bra if your breasts are sore.  Take warm water baths (sitz baths) to soothe pain or discomfort caused by hemorrhoids. Use hemorrhoid cream if your doctor says it is okay.  Rest with your legs raised if you have leg cramps or low back pain.  If you have puffy, bulging veins (varicose veins) in your legs: ? Wear support hose or compression stockings as told by your doctor. ? Raise (elevate) your feet for 15 minutes, 3-4 times a day. ? Limit salt in your food. Prenatal care  Schedule your prenatal visits by the twelfth week of pregnancy.  Write down your questions. Take them to your prenatal visits.  Keep all your prenatal visits as told by your doctor. This is important. Safety  Wear your seat belt at all times when driving.  Make a list of emergency phone numbers. The list should include numbers for family, friends, the hospital, and police and fire departments. General instructions  Ask your doctor for a referral to a local prenatal class. Begin classes no later than at the start of month 6 of your pregnancy.  Ask for help if you need counseling or if you need help with nutrition. Your doctor can give you advice or tell you where to go for help.  Do not use  hot tubs, steam rooms, or saunas.  Do not douche or use tampons or scented sanitary pads.  Do not cross your legs for long periods of time.  Avoid all herbs and alcohol. Avoid drugs that are not approved by your doctor.  Do not use any tobacco products, including cigarettes, chewing tobacco, and electronic cigarettes. If you need help quitting, ask your doctor. You may get counseling or other support to help you quit.  Avoid cat litter boxes and soil used by cats. These carry germs that can cause birth defects in the baby and can cause a loss of your baby (miscarriage) or stillbirth.  Visit your  dentist. At home, brush your teeth with a soft toothbrush. Be gentle when you floss. Contact a doctor if:  You are dizzy.  You have mild cramps or pressure in your lower belly.  You have a nagging pain in your belly area.  You continue to feel sick to your stomach, you throw up, or you have watery poop (diarrhea).  You have a bad smelling fluid coming from your vagina.  You have pain when you pee (urinate).  You have increased puffiness (swelling) in your face, hands, legs, or ankles. Get help right away if:  You have a fever.  You are leaking fluid from your vagina.  You have spotting or bleeding from your vagina.  You have very bad belly cramping or pain.  You gain or lose weight rapidly.  You throw up blood. It may look like coffee grounds.  You are around people who have [place] measles, fifth disease, or chickenpox.  You have a very bad headache.  You have shortness of breath.  You have any kind of trauma, such as from a fall or a car accident. Summary  The first trimester of pregnancy is from week 1 until the end of week 13 (months 1 through 3).  To take care of yourself and your unborn baby, you will need to eat healthy meals, take medicines only if your doctor tells you to do so, and do activities that are safe for you and your baby.  Keep all follow-up visits as told by your doctor. This is important as your doctor will have to ensure that your baby is healthy and growing well. This information is not intended to replace advice given to you by your health care provider. Make sure you discuss any questions you have with your health care provider. Document Released: 03/09/2008 Document Revised: 09/29/2016 Document Reviewed: 09/29/2016 Elsevier Interactive Patient Education  2017 [employer].

## 2017-10-12 NOTE — Progress Notes (Addendum)
[name]. Dawson, CNM into see pt and discuss ^HCG levels.  Pt informed will have U/S to verify pregnancy location.

## 2017-11-07 ENCOUNTER — Encounter (HOSPITAL_COMMUNITY): Payer: Self-pay | Admitting: *Deleted

## 2017-11-07 ENCOUNTER — Inpatient Hospital Stay (HOSPITAL_COMMUNITY)
Admission: AD | Admit: 2017-11-07 | Discharge: 2017-11-07 | Disposition: A | Discharge status: Home / Self Care | Payer: Medicaid Other | Source: Ambulatory Visit | Attending: Obstetrics & Gynecology | Admitting: Obstetrics & Gynecology

## 2017-11-07 ENCOUNTER — Other Ambulatory Visit: Payer: Self-pay

## 2017-11-07 DIAGNOSIS — Z3A11 11 weeks gestation of pregnancy: Secondary | ICD-10-CM | POA: Insufficient documentation

## 2017-11-07 DIAGNOSIS — O99611 Diseases of the digestive system complicating pregnancy, first trimester: Secondary | ICD-10-CM

## 2017-11-07 DIAGNOSIS — O26851 Spotting complicating pregnancy, first trimester: Secondary | ICD-10-CM | POA: Insufficient documentation

## 2017-11-07 DIAGNOSIS — K5900 Constipation, unspecified: Secondary | ICD-10-CM | POA: Insufficient documentation

## 2017-11-07 DIAGNOSIS — R109 Unspecified abdominal pain: Secondary | ICD-10-CM

## 2017-11-07 DIAGNOSIS — Z87891 Personal history of nicotine dependence: Secondary | ICD-10-CM | POA: Insufficient documentation

## 2017-11-07 DIAGNOSIS — O26891 Other specified pregnancy related conditions, first trimester: Secondary | ICD-10-CM

## 2017-11-07 LAB — URINALYSIS, ROUTINE W REFLEX MICROSCOPIC
BILIRUBIN URINE: NEGATIVE
GLUCOSE, UA: NEGATIVE mg/dL
HGB URINE DIPSTICK: NEGATIVE
KETONES UR: 20 mg/dL — AB
Leukocytes, UA: NEGATIVE
Nitrite: NEGATIVE
PH: 6 (ref 5.0–8.0)
PROTEIN: NEGATIVE mg/dL
Specific Gravity, Urine: 1.032 — ABNORMAL HIGH (ref 1.005–1.030)

## 2017-11-07 LAB — WET PREP, GENITAL
CLUE CELLS WET PREP: NONE SEEN
SPERM: NONE SEEN
TRICH WET PREP: NONE SEEN
YEAST WET PREP: NONE SEEN

## 2017-11-07 MED ORDER — POLYETHYLENE GLYCOL 3350 17 GM/SCOOP PO POWD: 17.0000 g | Freq: Every day | ORAL | 0 refills | Status: DC

## 2017-11-07 MED ORDER — DOCUSATE SODIUM 100 MG PO CAPS: 100.0000 mg | ORAL_CAPSULE | Freq: Two times a day (BID) | ORAL | 1 refills | Status: AC

## 2017-11-07 NOTE — Discharge Instructions (Signed)

## 2017-11-07 NOTE — MAU Note (Addendum)
Pt reports cramping/contractions for 1.5 hours. Pt also noticed some dark brown bleeding. Pain has now stopped. Pt has not started prenatal care yet, but in the process of getting her pregnancy medicaid. Pt was treated for chlamydia recently, but now she is concerned about some thick white discharge that she is having.

## 2017-11-07 NOTE — MAU Provider Note (Signed)
History  CSN: [id number] Arrival date and time: 11/07/17 1925  First Provider Initiated Contact with Patient 11/07/17 2000      Chief Complaint  Patient presents with  . Vaginal Bleeding  . Abdominal Pain    HPI: Jeri ModenaMichaela Esker is a 22 y.o. G2P1001 with IUP at 7355w2d who presents to maternity admissions reporting vaginal bleeding and cramping. Symptoms started today; reports cramping in mid-abdomen which lasted for about 1.5 hours, and now improved; states this felt like contractions. Noted some brownish spotting tonight was well, and she has been having white vaginal discharge. Reports being treated for chlamydia about a month ago; partner also got treated. Reports BMs have been regular. Some morning sickness with nausea, but no vomiting. No fever or chills.  OB History  Gravida Para Term Preterm AB Living  2 1 1  0 0 1  SAB TAB Ectopic Multiple Live Births  0 0 0   1    # Outcome Date GA Lbr Len/2nd Weight Sex Delivery Anes PTL Lv  2 Current           1 Term 05/26/16 3429w4d 13:33 / 07:20 7 lb 8.8 oz (3.425 kg) F Vag-Spont EPI, Local  LIV     Birth Comments: caput     Past Medical History:  Diagnosis Date  . 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 Headache    migraines   Past Surgical History:  Procedure Laterality Date  . NO PAST SURGERIES     No family history on file. Social History   Socioeconomic History  . Marital status: Single    Spouse name: Not on file  . Number of children: Not on file  . Years of education: Not on file  . Highest education level: Not on file  Social Needs  . Financial resource strain: Not on file  . Food insecurity - worry: Not on file  . Food insecurity - inability: Not on file  . Transportation needs - medical: Not on file  . Transportation needs - non-medical: Not on file  Occupational History  . Not on file  Tobacco Use  . Smoking status: Former Smoker    Last attempt to quit: 08/18/2015    Years since  quitting: 2.2  . Smokeless tobacco: Never Used  Substance and Sexual Activity  . Alcohol use: No  . Drug use: No  . Sexual activity: Yes    Partners: Female, Female    Birth control/protection: None  Other Topics Concern  . Not on file  Social History Narrative   ** Merged History Encounter **       Allergies  Allergen Reactions  . Nickel     Medications Prior to Admission  Medication Sig Dispense Refill Last Dose  . Prenatal Vit-Fe Phos-FA-Omega (VITAFOL GUMMIES) 3.33-0.333-34.8 MG CHEW CHEW THREE GUMMIES  BY MOUTH ONCE DAILY BEFORE BREAKFAST 90 tablet 0     I have reviewed patient's Past Medical Hx, Surgical Hx, Family Hx, Social Hx, medications and allergies.   Review of Systems: Negative except for what is mentioned in HPI.  Physical Exam   Blood pressure 106/62, pulse 82, temperature 98.5 F (36.9 C), temperature source Oral, resp. rate 18, last menstrual period 08/20/2017, SpO2 99 %, unknown if currently breastfeeding.  Constitutional: Well-developed, well-nourished female in no acute distress.  HENT: [place]/AT, normal oropharynx mucosa. MMM Eyes: normal conjunctivae, no scleral icterus Cardiovascular: normal rate Respiratory: normal effort GI: Abd soft, non-tender   GU: Neg CVAT. Pelvic:  NEFG. Speculum exam difficulty due to large amount of stool in the rectum felt through posterior vaginal wall; brownish discharge with no active bleeding. Cervix closed/long MSK: Extremities nontender, no edema Neurologic: Alert and oriented x 4. Psych: Normal mood and affect Skin: warm and dry   FHT:  158  MAU Course/MDM:   Nursing notes and VS reviewed. Patient seen and examined, as noted above.  Speculum exam as above. Wet prep, GC/Chlamydia collected  Wet prep with moderate WBC, otherwise negative.    Assessment and Plan  Assessment: 1. Spotting affecting pregnancy in first trimester   2. Constipation during pregnancy in first trimester     Plan: --Discharge home  in stable condition.  --Start colace BID and Miralax daily. Stop Miralax when having regular BMs --Start PNVs --Establish PNC  Sigmond Patalano, Kandra Nicolas, MD 11/07/2017 8:00 PM

## 2017-11-08 LAB — GC/CHLAMYDIA PROBE AMP (~~LOC~~) NOT AT ARMC
Chlamydia: NEGATIVE
Neisseria Gonorrhea: NEGATIVE

## 2017-12-11 ENCOUNTER — Emergency Department (HOSPITAL_COMMUNITY)
Admission: EM | Admit: 2017-12-11 | Discharge: 2017-12-11 | Disposition: A | Discharge status: Home / Self Care | Payer: Medicaid Other | Attending: Emergency Medicine | Admitting: Emergency Medicine

## 2017-12-11 ENCOUNTER — Encounter (HOSPITAL_COMMUNITY): Payer: Self-pay | Admitting: Emergency Medicine

## 2017-12-11 ENCOUNTER — Other Ambulatory Visit: Payer: Self-pay

## 2017-12-11 DIAGNOSIS — R05 Cough: Secondary | ICD-10-CM

## 2017-12-11 DIAGNOSIS — R059 Cough, unspecified: Secondary | ICD-10-CM

## 2017-12-11 DIAGNOSIS — Z3A20 20 weeks gestation of pregnancy: Secondary | ICD-10-CM | POA: Insufficient documentation

## 2017-12-11 DIAGNOSIS — O99512 Diseases of the respiratory system complicating pregnancy, second trimester: Secondary | ICD-10-CM | POA: Insufficient documentation

## 2017-12-11 DIAGNOSIS — Z87891 Personal history of nicotine dependence: Secondary | ICD-10-CM | POA: Insufficient documentation

## 2017-12-11 DIAGNOSIS — J069 Acute upper respiratory infection, unspecified: Secondary | ICD-10-CM | POA: Insufficient documentation

## 2017-12-11 LAB — RAPID STREP SCREEN (MED CTR MEBANE ONLY): Streptococcus, Group A Screen (Direct): NEGATIVE

## 2017-12-11 NOTE — ED Provider Notes (Signed)
Emergency Department Provider Note   I have reviewed the triage vital signs and the nursing notes.   HISTORY  Chief Complaint URI   HPI Christine Lambert is a 22 y.o. female with PMH arthritis and HA currently 5 months pregnant presents to the ED for URI symptoms including sore throat and cough with runny nose.  Since some mild dyspnea but denies any chest pain.  She has not had any productive cough or hemoptysis.  She has had several sick contacts who tested positive for the flu.  Her symptoms have been ongoing for the past 7 days. She denies any vomiting or diarrhea.  No body aches.  No abdominal pain.  No dysuria.  Denies any vaginal bleeding/spotting.  She is following closely with her OB and her pregnancy has so far been uncomplicated. No radiation or symptoms or modifying factors.    Past Medical History:  Diagnosis Date  . 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 Headache    migraines    There are no active problems to display for this patient.   Past Surgical History:  Procedure Laterality Date  . NO PAST SURGERIES      Current Outpatient Rx  . Order #: [id number] Class: Normal  . Order #: [id number] Class: Normal  . Order #: [id number] Class: Normal    Allergies Nickel  No family history on file.  Social History Social History   Tobacco Use  . Smoking status: Former Smoker    Last attempt to quit: 08/18/2015    Years since quitting: 2.3  . Smokeless tobacco: Never Used  Substance Use Topics  . Alcohol use: No  . Drug use: No    Review of Systems  Constitutional: No fever/chills Eyes: No visual changes. ENT: Positive sore throat. Positive runny nose.  Cardiovascular: Denies chest pain. Respiratory: Positive intermittent shortness of breath and cough.  Gastrointestinal: No abdominal pain.  No nausea, no vomiting.  No diarrhea.  No constipation. Genitourinary: Negative for dysuria. Musculoskeletal: Negative for back  pain. Skin: Negative for rash. Neurological: Negative for headaches, focal weakness or numbness.  10-point ROS otherwise negative.  ____________________________________________   PHYSICAL EXAM:  VITAL SIGNS: ED Triage Vitals [12/11/17 0042]  Enc Vitals Group     BP 108/70     Pulse Rate 85     Resp 18     Temp 98.2 F (36.8 C)     Temp Source Oral     SpO2 100 %     Weight 112 lb (50.8 kg)     Height 5\' 1"  (1.549 m)   Constitutional: Alert and oriented. Well appearing and in no acute distress. Eyes: Conjunctivae are normal.  Head: Atraumatic. Nose: Positive congestion/rhinnorhea. Mouth/Throat: Mucous membranes are moist.  Oropharynx with mild erythema. No exudate. No PTA. Managing oral secretions without difficulty.  Neck: No stridor. Positive cervical lymphadenopathy.  Cardiovascular: Normal rate, regular rhythm. Good peripheral circulation. Grossly normal heart sounds.   Respiratory: Normal respiratory effort.  No retractions. Lungs CTAB. Gastrointestinal: Soft and nontender. No distention. Abdomen is gravid.  Musculoskeletal: No lower extremity tenderness nor edema. No gross deformities of extremities. Neurologic:  Normal speech and language. No gross focal neurologic deficits are appreciated.  Skin:  Skin is warm, dry and intact. No rash noted.  ____________________________________________   LABS (all labs ordered are listed, but only abnormal results are displayed)  Labs Reviewed  RAPID STREP SCREEN (NOT AT [facility])  CULTURE, GROUP A STREP [facility])   ____________________________________________  RADIOLOGY  None ____________________________________________   PROCEDURES  Procedure(s) performed:   Procedures  None ____________________________________________   INITIAL IMPRESSION / ASSESSMENT AND PLAN / ED COURSE  Pertinent labs & imaging results that were available during my care of the patient were reviewed by me and considered in my medical decision  making (see chart for details).  Patient presents to the ED with 7 days of sore throat and URI symptoms. No increased WOB. No hypoxemia here. Normal lung exam. No indication for CXR or blood work. No UTI symptoms. No findings concerning for pregnancy complication. Plan for rapid strep and reassess.   01:44 AM Patient with negative rapid strep test. With 7 days of symptoms, no hypoxemia, and no fever I will not test for the flu at this time. Will have the patient continue supportive care at home along with oral hydration and close OB follow up. Provided a list of OTC medications generally considered safe to take during pregnancy.   At this time, I do not feel there is any life-threatening condition present. I have reviewed and discussed all results (EKG, imaging, lab, urine as appropriate), exam findings with patient. I have reviewed nursing notes and appropriate previous records.  I feel the patient is safe to be discharged home without further emergent workup. Discussed usual and customary return precautions. Patient and family (if present) verbalize understanding and are comfortable with this plan.  Patient will follow-up with their primary care provider. If they do not have a primary care provider, information for follow-up has been provided to them. All questions have been answered.  ____________________________________________  FINAL CLINICAL IMPRESSION(S) / ED DIAGNOSES  Final diagnoses:  Viral upper respiratory tract infection  Cough    Note:  This document was prepared using Dragon voice recognition software and may include unintentional dictation errors.  Alona BeneJoshua Avner Stroder, MD Emergency Medicine    Neema Fluegge, Arlyss RepressJoshua G, MD 12/11/17 [phone]

## 2017-12-11 NOTE — Discharge Instructions (Signed)
Condition  Safe Medications to Take During Pregnancy*  Allergy  Antihistamines including: Chlorpheniramine (Chlor-Trimeton, Efidac, Teldrin) Diphenhydramine (Benadryl) Loratadine (Alavert, Claritin, Loradamed, Tavist ND Allergy)  Cold and Flu  Robitussin (check which ones, some should not be use din 1st trimester), Trind-DM, Vicks Cough Syrup Saline nasal drops or spray Actifed, Dristan, Neosynephrine*, Sudafed (Check with your doctor first. Do not use in first trimester.) Tylenol (acetaminophen) or Tylenol Cold Warm salt/water gargle *Do not take "SA" (sustained action) forms or "Multi-Symptom" forms of these drugs. Constipation  Citrucil Colace  Fiberall/Fibercon Metamucil  Milk of Magnesia  Senekot Diarrhea  For 24 hours, only after 12 weeks of pregnancy:   Imodium  Kaopectate  Parepectolin First Aid Ointment  Bacitracin  J & J Neosporin  Headache Tylenol (acetaminophen) Heartburn  Gaviscon Maalox  Mylanta  Riopan Titralac TUMs Hemorrhoids  Anusol  Preparation H  Tucks  Witch hazel Nausea and Vomiting  Emetrex Emetrol (if not diabetic) Sea bands Vitamin B6 (100 mg tablet) Rashes  Benadryl cream Caladryl lotion or cream Hydrocortisone cream or ointment Oatmeal bath (Aveeno) Yeast Infection  Monistat or Terazol Do not insert applicator too far  *Please Note: No drug can be considered 100% safe to use during pregnancy.

## 2017-12-11 NOTE — ED Triage Notes (Signed)
Reports URI symptoms for a week.  Now c/o nausea.  States I think its from the drainage.  Pt is 5 months pregnant.

## 2017-12-11 NOTE — ED Notes (Signed)
Patient is A&Ox4 at this time.  Patient in no signs of distress.  Please see providers note for complete history and physical exam.  

## 2017-12-11 NOTE — ED Notes (Signed)
Patient Alert and oriented to baseline. Stable and ambulatory to baseline. Patient verbalized understanding of the discharge instructions.  Patient belongings were taken by the patient.   

## 2017-12-13 LAB — CULTURE, GROUP A STREP (THRC)

## 2018-01-27 ENCOUNTER — Inpatient Hospital Stay (HOSPITAL_COMMUNITY)
Admission: AD | Admit: 2018-01-27 | Discharge: 2018-01-27 | Disposition: A | Discharge status: Home / Self Care | Payer: Medicaid Other | Source: Ambulatory Visit | Attending: Obstetrics & Gynecology | Admitting: Obstetrics & Gynecology

## 2018-01-27 ENCOUNTER — Encounter (HOSPITAL_COMMUNITY): Payer: Self-pay

## 2018-01-27 ENCOUNTER — Inpatient Hospital Stay (HOSPITAL_COMMUNITY): Payer: Medicaid Other

## 2018-01-27 DIAGNOSIS — O26892 Other specified pregnancy related conditions, second trimester: Secondary | ICD-10-CM | POA: Insufficient documentation

## 2018-01-27 DIAGNOSIS — O9989 Other specified diseases and conditions complicating pregnancy, childbirth and the puerperium: Secondary | ICD-10-CM

## 2018-01-27 DIAGNOSIS — M549 Dorsalgia, unspecified: Secondary | ICD-10-CM | POA: Insufficient documentation

## 2018-01-27 DIAGNOSIS — O9A219 Injury, poisoning and certain other consequences of external causes complicating pregnancy, unspecified trimester: Secondary | ICD-10-CM | POA: Diagnosis present

## 2018-01-27 DIAGNOSIS — O9A212 Injury, poisoning and certain other consequences of external causes complicating pregnancy, second trimester: Secondary | ICD-10-CM

## 2018-01-27 DIAGNOSIS — Z3A21 21 weeks gestation of pregnancy: Secondary | ICD-10-CM

## 2018-01-27 DIAGNOSIS — W19XXXA Unspecified fall, initial encounter: Secondary | ICD-10-CM

## 2018-01-27 DIAGNOSIS — T1490XA Injury, unspecified, initial encounter: Secondary | ICD-10-CM

## 2018-01-27 LAB — URINALYSIS, ROUTINE W REFLEX MICROSCOPIC
BILIRUBIN URINE: NEGATIVE
GLUCOSE, UA: NEGATIVE mg/dL
KETONES UR: 5 mg/dL — AB
NITRITE: POSITIVE — AB
PH: 5 (ref 5.0–8.0)
Protein, ur: NEGATIVE mg/dL
SPECIFIC GRAVITY, URINE: 1.028 (ref 1.005–1.030)

## 2018-01-27 NOTE — MAU Note (Signed)
Pt stated she fell down the stairs rolling down on her stomach and  on  to her back . Happen around 1715 this afternoon. C/o back pain

## 2018-01-27 NOTE — MAU Provider Note (Addendum)
History     CSN: [id number]  Arrival date and time: 01/27/18 [id number]   First Provider Initiated Contact with Patient 01/27/18 2033      Chief Complaint  Patient presents with  . Fall  . Back Pain   HPI  Ms.  Christine Lambert is a 22 y.o. year old 492P1001 female at 21w 1d weeks gestation who presents to MAU reporting that she fell/rolled down the stairs after tripping on her shoe lace. She reports she hit her abdomen on the way down the stairs and now reports low back pain. She reports (+) FM.  Past Medical History:  Diagnosis Date  . 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 Headache    migraines    Past Surgical History:  Procedure Laterality Date  . NO PAST SURGERIES      No family history on file.  Social History   Tobacco Use  . Smoking status: Former Smoker    Last attempt to quit: 08/18/2015    Years since quitting: 2.4  . Smokeless tobacco: Never Used  Substance Use Topics  . Alcohol use: No  . Drug use: No    Allergies:  Allergies  Allergen Reactions  . Nickel Rash    Medications Prior to Admission  Medication Sig Dispense Refill Last Dose  . diphenhydrAMINE (BENADRYL) 25 MG tablet Take 25 mg by mouth at bedtime as needed for allergies.   Past Week at Unknown time  . polyethylene glycol powder (MIRALAX) powder Take 17 g by mouth daily. (Patient not taking: Reported on 01/27/2018) 500 g 0 Not Taking at Unknown time    Review of Systems  Constitutional: Negative.   HENT: Negative.   Eyes: Negative.   Respiratory: Negative.   Cardiovascular: Negative.   Gastrointestinal: Positive for abdominal pain (RLQ after hitting on stairs during fall this evening).  Endocrine: Negative.   Genitourinary: Negative.   Musculoskeletal: Positive for back pain.  Allergic/Immunologic: Negative.    Physical Exam   Blood pressure 104/69, pulse 74, temperature 98.6 F (37 C), resp. rate 18, height 5\' 1"  (1.549 m), weight 126 lb (57.2 kg),  last menstrual period 08/20/2017.  Physical Exam  Constitutional: She is oriented to person, place, and time. She appears well-developed and well-nourished.  HENT:  Head: Normocephalic and atraumatic.  Eyes: Pupils are equal, round, and reactive to light.  Neck: Normal range of motion.  Respiratory: Effort normal.  GI: Soft.  No erythema, ecchymosis or abrasions noted on abdomen  Genitourinary:  Genitourinary Comments: pelvic deferred  Musculoskeletal: Normal range of motion.  Neurological: She is alert and oriented to person, place, and time.  Skin: Skin is warm and dry.  Psychiatric: She has a normal mood and affect. Her behavior is normal. Thought content normal.  Pt states she is safe at home    MAU Course  Procedures  MDM Toco only -- no UC's seen Offered Flexeril or Tylenol 1000 mg -- patient declined "I don't like to take medications when I'm pregnant." OB MFM Limited U/S to check placenta  Results for orders placed or performed during the hospital encounter of 01/27/18 (from the past 24 hour(s))  Urinalysis, Routine w reflex microscopic     Status: Abnormal   Collection Time: 01/27/18  6:20 PM  Result Value Ref Range   Color, Urine YELLOW YELLOW   APPearance CLOUDY (A) CLEAR   Specific Gravity, Urine 1.028 1.005 - 1.030   pH 5.0 5.0 - 8.0   Glucose, UA  NEGATIVE NEGATIVE mg/dL   Hgb urine dipstick SMALL (A) NEGATIVE   Bilirubin Urine NEGATIVE NEGATIVE   Ketones, ur 5 (A) NEGATIVE mg/dL   Protein, ur NEGATIVE NEGATIVE mg/dL   Nitrite POSITIVE (A) NEGATIVE   Leukocytes, UA MODERATE (A) NEGATIVE   RBC / HPF 0-5 0 - 5 RBC/hpf   WBC, UA 21-50 0 - 5 WBC/hpf   Bacteria, UA MANY (A) NONE SEEN   Squamous Epithelial / LPF 0-5 0 - 5   Mucus PRESENT    Care assumed by & report given to Cathie Beams, CNM @ 2155  Raelyn Mora, MSN, CNM 01/27/2018, 8:39 PM   Assessment and Plan  US shows no evidence of abruption/Cx 3.9cms long.  No UI on EFM. Pt stable for  DC

## 2018-01-27 NOTE — Discharge Instructions (Signed)
Preventing Injuries During Pregnancy °Trauma is the most common cause of injury and death in pregnant women. This can also result in serious harm to the baby or even death. °How can injuries affect my pregnancy? °Your baby is protected in the womb (uterus) by a sac filled with fluid (amniotic sac). Your baby can be harmed if there is a direct blow to your abdomen and pelvis. Trauma may be caused by: °· Falls. These are more common in the second and third trimester of pregnancy. °· Automobile accidents. °· Domestic violence or assault. °· Severe burns, such as from fire or electricity. ° °These injuries can result in: °· Tearing of your uterus. °· The placenta pulling away from the wall of the uterus (placental abruption). °· The amniotic sac breaking open (rupture of membranes). °· Blockage or decrease in the blood supply to your baby. °· Going into labor earlier than expected. °· Severe injuries to other parts of your body, such as your brain, spine, heart, lungs, or other organs. ° °Minor falls and low-impact automobile accidents do not usually harm your baby, even if they cause a little harm to you. °What can I do to lower my risk? °Safety °· Remove slippery rugs and loose objects on the floor. They increase your risk of tripping or slipping. °· Wear comfortable shoes that have a good grip on the sole. Do not wear high-heeled shoes. °· Always wear your seat belt properly when riding in a car. Use both the lap and shoulder belt, with the lap belt below your abdomen. Always practice safe driving. Do not ride on a motorcycle while pregnant. °Activity °· Avoid walking on wet or slippery floors. °· Do not participate in rough and violent activities or sports. °· Avoid high-risk situations and activities such as: °? Lifting heavy pots of boiling or hot liquids. °? Fixing electrical problems. °? Being near fires or starting fires. °General instructions °· Take over-the-counter and prescription medicines only as told by  your health care provider. °· Know your blood type and the father's blood type in case you develop vaginal bleeding or experience an injury for which a blood transfusion is needed. °· Spousal abuse can be a serious cause of trauma during pregnancy. If you are a victim of domestic violence or assault: °? Call your local emergency services (911 in the [place] °? Contact the National Domestic Violence Hotline for help and support. °When should I seek immediate medical care? °Get help right away if: °· You fall on your abdomen or experience any serious blow to your abdomen. °· You develop stiffness in your neck or pain after a fall or from other trauma. °· You develop a headache or vision problems after a fall or from other trauma. °· You do not feel the baby moving after a fall or trauma, or you feel that the baby is not moving as much as before the fall or trauma. °· You have been the victim of domestic violence or any other kind of physical attack. °· You have been in a car accident. °· You develop vaginal bleeding. °· You have fluid leaking from the vagina. °· You develop uterine contractions. Symptoms include pelvic cramping, pain, or serious low back pain. °· You become weak, faint, or have uncontrolled vomiting after trauma. °· You have a serious burn. This includes burns to the face, neck, hands, or genitals, or burns greater than the size of your palm anywhere else. ° °Summary °· Trauma is the most common cause of   injury and death in pregnant women and can also lead to injury or death of the baby. °· Falls, automobile accidents, domestic violence or assault, and severe burns can injure you or your baby. Make sure to get medical help right away if you experience any of these during your pregnancy. °· Take steps to prevent slips or falls in your home, such as avoiding slippery floors and removing loose rugs. °· Always wear your seat belt properly when riding in a car. Practice safe driving. °This information is  not intended to replace advice given to you by your health care provider. Make sure you discuss any questions you have with your health care provider. °Document Released: 10/29/2004 Document Revised: 09/30/2016 Document Reviewed: 09/30/2016 °Elsevier Interactive Patient Education © 2017 [employer] ° °

## 2018-03-01 ENCOUNTER — Ambulatory Visit (INDEPENDENT_AMBULATORY_CARE_PROVIDER_SITE_OTHER): Payer: Self-pay | Admitting: Obstetrics and Gynecology

## 2018-03-01 ENCOUNTER — Other Ambulatory Visit (HOSPITAL_COMMUNITY)
Admission: RE | Admit: 2018-03-01 | Discharge: 2018-03-01 | Disposition: A | Discharge status: Home / Self Care | Payer: Medicaid Other | Source: Ambulatory Visit | Attending: Obstetrics and Gynecology | Admitting: Obstetrics and Gynecology

## 2018-03-01 ENCOUNTER — Encounter: Payer: Self-pay | Admitting: Obstetrics and Gynecology

## 2018-03-01 DIAGNOSIS — Z3482 Encounter for supervision of other normal pregnancy, second trimester: Secondary | ICD-10-CM

## 2018-03-01 DIAGNOSIS — Z3480 Encounter for supervision of other normal pregnancy, unspecified trimester: Secondary | ICD-10-CM | POA: Insufficient documentation

## 2018-03-01 DIAGNOSIS — Z8669 Personal history of other diseases of the nervous system and sense organs: Secondary | ICD-10-CM

## 2018-03-01 DIAGNOSIS — M1990 Unspecified osteoarthritis, unspecified site: Secondary | ICD-10-CM | POA: Insufficient documentation

## 2018-03-01 DIAGNOSIS — Z3A00 Weeks of gestation of pregnancy not specified: Secondary | ICD-10-CM | POA: Diagnosis not present

## 2018-03-01 LAB — POCT URINALYSIS DIP (DEVICE)
BILIRUBIN URINE: NEGATIVE
Glucose, UA: NEGATIVE mg/dL
NITRITE: NEGATIVE
PH: 6 (ref 5.0–8.0)
PROTEIN: 30 mg/dL — AB
Specific Gravity, Urine: >=1.03 (ref 1.005–1.030)
UROBILINOGEN UA: 0.2 mg/dL (ref 0.0–1.0)

## 2018-03-01 MED ORDER — TERCONAZOLE 0.4 % VA CREA
1.0000 | TOPICAL_CREAM | Freq: Every day | VAGINAL | 0 refills | Status: DC
Start: 2018-03-01 — End: 2018-05-31

## 2018-03-01 NOTE — Progress Notes (Signed)
Subjective:   Stuart Guillen is a 22 y.o. G2P1001 at [redacted]w[redacted]d by certain LMP, however dating inially by MSD, being seen today for her first obstetrical visit.  Her obstetrical history is significant for arthritis, migraine HA. Patient does not intend to breast feed. Pregnancy history fully reviewed. Unplanned pregnancy; excited now. FOB involved, lives together. Daughter who is 1 Surveyor, mining)  Patient reports no complaints.  HISTORY: OB History  Gravida Para Term Preterm AB Living  0 0 1  SAB TAB Ectopic Multiple Live Births  0 0 0 0 1    # Outcome Date GA Lbr Len/2nd Weight Sex Delivery Anes PTL Lv  2 Current           1 Term 05/26/16 [redacted]w[redacted]d 13:33 / 07:20 7 lb 8.8 oz (3.425 kg) F Vag-Spont EPI, Local  LIV     Birth Comments: caput     Name: CIANA, SIMMON     Apgar1: 7  Apgar5: 8    Last pap smear was done 5/28 and was pending results   Past Medical History:  Diagnosis Date  . 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 Headache    migraines   Past Surgical History:  Procedure Laterality Date  . NO PAST SURGERIES     History reviewed. No pertinent family history. Social History   Tobacco Use  . Smoking status: Former Smoker    Last attempt to quit: 08/18/2015    Years since quitting: 2.5  . Smokeless tobacco: Never Used  Substance Use Topics  . Alcohol use: No  . Drug use: No   Allergies  Allergen Reactions  . Nickel Rash   Current Outpatient Medications on File Prior to Visit  Medication Sig Dispense Refill  . diphenhydrAMINE (BENADRYL) 25 MG tablet Take 25 mg by mouth at bedtime as needed for allergies.    . polyethylene glycol powder (MIRALAX) powder Take 17 g by mouth daily. 500 g 0  . Prenatal Vit-Fe Fumarate-FA (MULTIVITAMIN-PRENATAL) 27-0.8 MG TABS tablet Take 1 tablet by mouth daily at 12 noon.     No current facility-administered medications on file prior to visit.     Review of Systems Pertinent items noted  in HPI and remainder of comprehensive ROS otherwise negative.  Exam   Vitals:   03/01/18 1058  BP: 101/69  Pulse: 88  Weight: 129 lb 9.6 oz (58.8 kg)   Fetal Heart Rate (bpm): 145   BP 101/69   Pulse 88   Wt 129 lb 9.6 oz (58.8 kg)   LMP 08/20/2017   BMI 24.49 kg/m  Uterine Size: size equals dates  Pelvic Exam:    Perineum: No Hemorrhoids, Normal Perineum   Vulva: normal   Vagina:  normal mucosa, normal discharge, no palpable nodules   pH: Not done   Cervix: no bleeding following Pap, no cervical motion tenderness and no lesions   Adnexa: normal adnexa and no mass, fullness, tenderness   Bony Pelvis: Adequate  System: Breast:  No nipple retraction or dimpling, No nipple discharge or bleeding, No axillary or supraclavicular adenopathy, Normal to palpation without dominant masses   Skin: normal coloration and turgor, no rashes    Neurologic: negative   Extremities: normal strength, tone, and muscle mass   HEENT neck supple with midline trachea and thyroid without masses   Mouth/Teeth mucous membranes moist, pharynx normal without lesions   Neck supple and no masses   Cardiovascular: regular rate and rhythm,  no murmurs or gallops   Respiratory:  appears well, vitals normal, no respiratory distress, acyanotic, normal RR, neck free of mass or lymphadenopathy, chest clear, no wheezing, crepitations, rhonchi, normal symmetric air entry   Abdomen: soft, non-tender; bowel sounds normal; no masses,  no organomegaly   Urinary: urethral meatus normal     Assessment:   Pregnancy: G2P1001 Patient Active Problem List   Diagnosis Date Noted  . Supervision of other normal pregnancy, antepartum 03/01/2018  . Arthritis 03/01/2018  . History of migraine headaches 03/01/2018  . Traumatic injury during pregnancy 01/27/2018     Plan:  1. Supervision of other normal pregnancy, antepartum  - [facility] AMB BABYSCRIPTS OPT IN - Culture, OB Urine - Cystic fibrosis gene test -  Hemoglobinopathy Evaluation - Obstetric Panel, Including HIV - SMN1 COPY NUMBER ANALYSIS (SMA Carrier Screen) - [place] MFM OB COMP + 14 WK; Future - POCT urinalysis dip (device) - Prenatal Vit-Fe Fumarate-FA (MULTIVITAMIN-PRENATAL) 27-0.8 MG TABS tablet; Take 1 tablet by mouth daily at 12 noon. - Genetic Screening - Cytology - PAP   Initial labs drawn. Continue prenatal vitamins. Genetic Screening discussed, NIPS: requested. Ultrasound discussed; fetal anatomic survey: requested. Problem list reviewed and updated. The nature of [facility] with multiple MDs and other Advanced Practice Providers was explained to patient; also emphasized that residents, students are part of our team. Routine obstetric precautions reviewed. Return in about 1 month (around 03/29/2018) for come fasting for 2 hour GTT testing .    Malic Rosten, Harolyn Rutherford, NP [facility] for [employer], [facility]

## 2018-03-03 LAB — CYTOLOGY - PAP
Adequacy: ABSENT
Chlamydia: NEGATIVE
DIAGNOSIS: NEGATIVE
Neisseria Gonorrhea: NEGATIVE

## 2018-03-05 LAB — URINE CULTURE, OB REFLEX

## 2018-03-05 LAB — CULTURE, OB URINE

## 2018-03-07 ENCOUNTER — Other Ambulatory Visit: Payer: Self-pay | Admitting: Obstetrics and Gynecology

## 2018-03-07 ENCOUNTER — Ambulatory Visit (HOSPITAL_COMMUNITY)
Admission: RE | Admit: 2018-03-07 | Discharge: 2018-03-07 | Disposition: A | Discharge status: Home / Self Care | Payer: Medicaid Other | Source: Ambulatory Visit | Attending: Obstetrics and Gynecology | Admitting: Obstetrics and Gynecology

## 2018-03-07 DIAGNOSIS — Z3A26 26 weeks gestation of pregnancy: Secondary | ICD-10-CM | POA: Insufficient documentation

## 2018-03-07 DIAGNOSIS — Z363 Encounter for antenatal screening for malformations: Secondary | ICD-10-CM | POA: Diagnosis present

## 2018-03-07 DIAGNOSIS — O2343 Unspecified infection of urinary tract in pregnancy, third trimester: Secondary | ICD-10-CM

## 2018-03-07 DIAGNOSIS — O99891 Other specified diseases and conditions complicating pregnancy: Secondary | ICD-10-CM | POA: Insufficient documentation

## 2018-03-07 DIAGNOSIS — R8271 Bacteriuria: Secondary | ICD-10-CM | POA: Insufficient documentation

## 2018-03-07 DIAGNOSIS — O9989 Other specified diseases and conditions complicating pregnancy, childbirth and the puerperium: Secondary | ICD-10-CM

## 2018-03-07 DIAGNOSIS — Z3480 Encounter for supervision of other normal pregnancy, unspecified trimester: Secondary | ICD-10-CM

## 2018-03-07 LAB — HEMOGLOBINOPATHY EVALUATION
FERRITIN: 14 ng/mL — AB (ref 15–150)
HGB C: 0 %
HGB S: 0 %
Hgb A2 Quant: 2.4 % (ref 1.8–3.2)
Hgb A: 97.6 % (ref 96.4–98.8)
Hgb F Quant: 0 % (ref 0.0–2.0)
Hgb Solubility: NEGATIVE
Hgb Variant: 0 %

## 2018-03-07 LAB — SMN1 COPY NUMBER ANALYSIS (SMA CARRIER SCREENING)

## 2018-03-07 LAB — OBSTETRIC PANEL, INCLUDING HIV
ANTIBODY SCREEN: NEGATIVE
BASOS ABS: 0 10*3/uL (ref 0.0–0.2)
BASOS: 0 %
EOS (ABSOLUTE): 0.1 10*3/uL (ref 0.0–0.4)
Eos: 1 %
HEMATOCRIT: 32.4 % — AB (ref 34.0–46.6)
HIV Screen 4th Generation wRfx: NONREACTIVE
Hemoglobin: 10.7 g/dL — ABNORMAL LOW (ref 11.1–15.9)
Hepatitis B Surface Ag: NEGATIVE
IMMATURE GRANS (ABS): 0.2 10*3/uL — AB (ref 0.0–0.1)
IMMATURE GRANULOCYTES: 2 %
LYMPHS: 18 %
Lymphocytes Absolute: 1.7 10*3/uL (ref 0.7–3.1)
MCH: 30.4 pg (ref 26.6–33.0)
MCHC: 33 g/dL (ref 31.5–35.7)
MCV: 92 fL (ref 79–97)
MONOCYTES: 7 %
MONOS ABS: 0.7 10*3/uL (ref 0.1–0.9)
NEUTROS PCT: 72 %
Neutrophils Absolute: 6.8 10*3/uL (ref 1.4–7.0)
PLATELETS: 301 10*3/uL (ref 150–450)
RBC: 3.52 x10E6/uL — AB (ref 3.77–5.28)
RDW: 14.5 % (ref 12.3–15.4)
RPR Ser Ql: NONREACTIVE
RUBELLA: 1.22 {index} (ref >0.99)
Rh Factor: POSITIVE
WBC: 9.4 10*3/uL (ref 3.4–10.8)

## 2018-03-07 LAB — CYSTIC FIBROSIS GENE TEST

## 2018-03-07 MED ORDER — CEPHALEXIN 500 MG PO CAPS: 500.0000 mg | ORAL_CAPSULE | Freq: Four times a day (QID) | ORAL | 0 refills | Status: DC

## 2018-03-07 NOTE — Progress Notes (Signed)
+   [facility]. Attempted to contact patient on 6/3 @ 0830. No answer and no option to leave VM. Keflex was called into her pharmacy and she should be instructed to pick this up.    Duane Lopeasch, Ashlen Kiger I, NP 03/07/2018 8:36 AM

## 2018-03-08 DIAGNOSIS — Z3A26 26 weeks gestation of pregnancy: Secondary | ICD-10-CM | POA: Insufficient documentation

## 2018-03-08 DIAGNOSIS — Z3689 Encounter for other specified antenatal screening: Secondary | ICD-10-CM | POA: Insufficient documentation

## 2018-03-09 ENCOUNTER — Encounter: Payer: Self-pay | Admitting: *Deleted

## 2018-03-11 ENCOUNTER — Telehealth: Payer: Self-pay | Admitting: *Deleted

## 2018-03-11 ENCOUNTER — Encounter: Payer: Self-pay | Admitting: *Deleted

## 2018-03-11 MED ORDER — FLUCONAZOLE 150 MG PO TABS: 150.0000 mg | ORAL_TABLET | Freq: Once | ORAL | 0 refills | Status: AC

## 2018-03-11 NOTE — Telephone Encounter (Signed)
Pt has not been notified of +UTI. I called [facility] pharmacy and confirmed she has not obtained Rx Keflex. Pt also was not able to obtain Terazol vaginal cream as prescribed due to cost ($44). Rx obtained from Dr. Nira Retortegele for Diflucan and e-prescribed to pharmacy. Called pt to inform her of all information. Pt's mother answered and stated that pt is @ work. She provided alternate phone number [phone]. I left detailed message for pt regarding +UTI, Rx sent and also alternate Rx sent for vaginal yeast.

## 2018-03-29 ENCOUNTER — Other Ambulatory Visit: Payer: Self-pay | Admitting: *Deleted

## 2018-03-29 ENCOUNTER — Encounter: Payer: Self-pay | Admitting: Student

## 2018-03-29 DIAGNOSIS — O0932 Supervision of pregnancy with insufficient antenatal care, second trimester: Secondary | ICD-10-CM | POA: Insufficient documentation

## 2018-03-29 DIAGNOSIS — Z3480 Encounter for supervision of other normal pregnancy, unspecified trimester: Secondary | ICD-10-CM

## 2018-03-30 ENCOUNTER — Other Ambulatory Visit: Payer: Medicaid Other

## 2018-03-30 ENCOUNTER — Encounter: Payer: Medicaid Other | Admitting: Student

## 2018-04-12 ENCOUNTER — Other Ambulatory Visit (HOSPITAL_COMMUNITY)
Admission: RE | Admit: 2018-04-12 | Discharge: 2018-04-12 | Disposition: A | Discharge status: Home / Self Care | Payer: Medicaid Other | Source: Ambulatory Visit | Attending: Advanced Practice Midwife | Admitting: Advanced Practice Midwife

## 2018-04-12 ENCOUNTER — Ambulatory Visit (INDEPENDENT_AMBULATORY_CARE_PROVIDER_SITE_OTHER): Payer: Medicaid Other | Admitting: Advanced Practice Midwife

## 2018-04-12 VITALS — BP 105/62 | HR 86 | Wt 136.1 lb

## 2018-04-12 DIAGNOSIS — O26893 Other specified pregnancy related conditions, third trimester: Secondary | ICD-10-CM

## 2018-04-12 DIAGNOSIS — Z3483 Encounter for supervision of other normal pregnancy, third trimester: Secondary | ICD-10-CM

## 2018-04-12 DIAGNOSIS — N898 Other specified noninflammatory disorders of vagina: Secondary | ICD-10-CM | POA: Insufficient documentation

## 2018-04-12 DIAGNOSIS — O4703 False labor before 37 completed weeks of gestation, third trimester: Secondary | ICD-10-CM

## 2018-04-12 DIAGNOSIS — Z23 Encounter for immunization: Secondary | ICD-10-CM

## 2018-04-12 DIAGNOSIS — Z3480 Encounter for supervision of other normal pregnancy, unspecified trimester: Secondary | ICD-10-CM

## 2018-04-12 NOTE — Progress Notes (Signed)
   PRENATAL VISIT NOTE  Subjective:  Christine Lambert is a 22 y.o. G2P1001 at 1523w6d being seen today for ongoing prenatal care.  She is currently monitored for the following issues for this low-risk pregnancy and has Traumatic injury during pregnancy; Supervision of other normal pregnancy, antepartum; Arthritis; History of migraine headaches; UTI in pregnancy, antepartum, third trimester; [redacted] weeks gestation of pregnancy; Encounter for fetal anatomic survey; and Limited prenatal care in second trimester on their problem list.  Patient reports cramping/contractions every night that resolve, passing of mucus plug 2 days ago, worsening intermittent cramping since then.  Contractions: Irritability. Vag. Bleeding: None.  Movement: Present. Denies leaking of fluid.   The following portions of the patient's history were reviewed and updated as appropriate: allergies, current medications, past family history, past medical history, past social history, past surgical history and problem list. Problem list updated.  Objective:   Vitals:   04/12/18 1617  BP: 105/62  Pulse: 86  Weight: 136 lb 1.6 oz (61.7 kg)    Fetal Status: Fetal Heart Rate (bpm): 148 Fundal Height: 31 cm Movement: Present     General:  Alert, oriented and cooperative. Patient is in no acute distress.  Skin: Skin is warm and dry. No rash noted.   Cardiovascular: Normal heart rate noted  Respiratory: Normal respiratory effort, no problems with respiration noted  Abdomen: Soft, gravid, appropriate for gestational age.  Pain/Pressure: Present     Pelvic: Cervical exam performed Dilation: Closed Effacement (%): 50 Station: Ballotable  Extremities: Normal range of motion.  Edema: None  Mental Status: Normal mood and affect. Normal behavior. Normal judgment and thought content.   Assessment and Plan:  Pregnancy: G2P1001 at 3823w6d  1. Supervision of other normal pregnancy, antepartum  - CBC - HIV antibody - RPR - Tdap vaccine  greater than or equal to 7yo IM - Glucose tolerance, 1 hour  2. Vaginal discharge during pregnancy in third trimester  - Cervicovaginal ancillary only  3. Preterm uterine contractions in third trimester, antepartum --No evidence of preterm labor with closed cervix.  Preterm labor/reasons to return to care reviewed.  Preterm labor symptoms and general obstetric precautions including but not limited to vaginal bleeding, contractions, leaking of fluid and fetal movement were reviewed in detail with the patient. Please refer to After Visit Summary for other counseling recommendations.  Return in about 2 weeks (around 04/26/2018).  Future Appointments  Date Time Provider Department Center  04/26/2018  1:15 PM Armando ReichertHogan, Heather D, [facility]  05/10/2018  4:15 PM Crisoforo OxfordKooistra, Charlesetta GaribaldiKathryn Lorraine, CNM [facility]  05/17/2018  4:15 PM Rolm BookbinderNeill, Caroline M, CNM [facility]  05/24/2018  4:15 PM Crisoforo OxfordKooistra, Charlesetta GaribaldiKathryn Lorraine, CNM [facility]  05/31/2018  4:20 PM NorrisSlough, Lake RipleyLaurel B, DO [facility]    [place], [place]

## 2018-04-12 NOTE — Patient Instructions (Signed)
Third Trimester of Pregnancy The third trimester is from week 28 through week 40 (months 7 through 9). The third trimester is a time when the unborn baby (fetus) is growing rapidly. At the end of the ninth month, the fetus is about 20 inches in length and weighs 6-10 pounds. Body changes during your third trimester Your body will continue to go through many changes during pregnancy. The changes vary from woman to woman. During the third trimester:  Your weight will continue to increase. You can expect to gain 25-35 pounds (11-16 kg) by the end of the pregnancy.  You may begin to get stretch marks on your hips, abdomen, and breasts.  You may urinate more often because the fetus is moving lower into your pelvis and pressing on your bladder.  You may develop or continue to have heartburn. This is caused by increased hormones that slow down muscles in the digestive tract.  You may develop or continue to have constipation because increased hormones slow digestion and cause the muscles that push waste through your intestines to relax.  You may develop hemorrhoids. These are swollen veins (varicose veins) in the rectum that can itch or be painful.  You may develop swollen, bulging veins (varicose veins) in your legs.  You may have increased body aches in the pelvis, back, or thighs. This is due to weight gain and increased hormones that are relaxing your joints.  You may have changes in your hair. These can include thickening of your hair, rapid growth, and changes in texture. Some women also have hair loss during or after pregnancy, or hair that feels dry or thin. Your hair will most likely return to normal after your baby is born.  Your breasts will continue to grow and they will continue to become tender. A yellow fluid (colostrum) may leak from your breasts. This is the first milk you are producing for your baby.  Your belly button may stick out.  You may notice more swelling in your hands,  face, or ankles.  You may have increased tingling or numbness in your hands, arms, and legs. The skin on your belly may also feel numb.  You may feel short of breath because of your expanding uterus.  You may have more problems sleeping. This can be caused by the size of your belly, increased need to urinate, and an increase in your body's metabolism.  You may notice the fetus "dropping," or moving lower in your abdomen (lightening).  You may have increased vaginal discharge.  You may notice your joints feel loose and you may have pain around your pelvic bone.  What to expect at prenatal visits You will have prenatal exams every 2 weeks until week 36. Then you will have weekly prenatal exams. During a routine prenatal visit:  You will be weighed to make sure you and the baby are growing normally.  Your blood pressure will be taken.  Your abdomen will be measured to track your baby's growth.  The fetal heartbeat will be listened to.  Any test results from the previous visit will be discussed.  You may have a cervical check near your due date to see if your cervix has softened or thinned (effaced).  You will be tested for Group B streptococcus. This happens between 35 and 37 weeks.  Your health care provider may ask you:  What your birth plan is.  How you are feeling.  If you are feeling the baby move.  If you have had   any abnormal symptoms, such as leaking fluid, bleeding, severe headaches, or abdominal cramping.  If you are using any tobacco products, including cigarettes, chewing tobacco, and electronic cigarettes.  If you have any questions.  Other tests or screenings that may be performed during your third trimester include:  Blood tests that check for low iron levels (anemia).  Fetal testing to check the health, activity level, and growth of the fetus. Testing is done if you have certain medical conditions or if there are problems during the  pregnancy.  Nonstress test (NST). This test checks the health of your baby to make sure there are no signs of problems, such as the baby not getting enough oxygen. During this test, a belt is placed around your belly. The baby is made to move, and its heart rate is monitored during movement.  What is false labor? False labor is a condition in which you feel small, irregular tightenings of the muscles in the womb (contractions) that usually go away with rest, changing position, or drinking water. These are called Braxton Hicks contractions. Contractions may last for hours, days, or even weeks before true labor sets in. If contractions come at regular intervals, become more frequent, increase in intensity, or become painful, you should see your health care provider. What are the signs of labor?  Abdominal cramps.  Regular contractions that start at 10 minutes apart and become stronger and more frequent with time.  Contractions that start on the top of the uterus and spread down to the lower abdomen and back.  Increased pelvic pressure and dull back pain.  A watery or bloody mucus discharge that comes from the vagina.  Leaking of amniotic fluid. This is also known as your "water breaking." It could be a slow trickle or a gush. Let your health care provider know if it has a color or strange odor. If you have any of these signs, call your health care provider right away, even if it is before your due date. Follow these instructions at home: Medicines  Follow your health care provider's instructions regarding medicine use. Specific medicines may be either safe or unsafe to take during pregnancy.  Take a prenatal vitamin that contains at least 600 micrograms (mcg) of folic acid.  If you develop constipation, try taking a stool softener if your health care provider approves. Eating and drinking  Eat a balanced diet that includes fresh fruits and vegetables, whole grains, good sources of protein  such as meat, eggs, or tofu, and low-fat dairy. Your health care provider will help you determine the amount of weight gain that is right for you.  Avoid raw meat and uncooked cheese. These carry germs that can cause birth defects in the baby.  If you have low calcium intake from food, talk to your health care provider about whether you should take a daily calcium supplement.  Eat four or five small meals rather than three large meals a day.  Limit foods that are high in fat and processed sugars, such as fried and sweet foods.  To prevent constipation: ? Drink enough fluid to keep your urine clear or pale yellow. ? Eat foods that are high in fiber, such as fresh fruits and vegetables, whole grains, and beans. Activity  Exercise only as directed by your health care provider. Most women can continue their usual exercise routine during pregnancy. Try to exercise for 30 minutes at least 5 days a week. Stop exercising if you experience uterine contractions.  Avoid heavy   lifting.  Do not exercise in extreme heat or humidity, or at high altitudes.  Wear low-heel, comfortable shoes.  Practice good posture.  You may continue to have sex unless your health care provider tells you otherwise. Relieving pain and discomfort  Take frequent breaks and rest with your legs elevated if you have leg cramps or low back pain.  Take warm sitz baths to soothe any pain or discomfort caused by hemorrhoids. Use hemorrhoid cream if your health care provider approves.  Wear a good support bra to prevent discomfort from breast tenderness.  If you develop varicose veins: ? Wear support pantyhose or compression stockings as told by your healthcare provider. ? Elevate your feet for 15 minutes, 3-4 times a day. Prenatal care  Write down your questions. Take them to your prenatal visits.  Keep all your prenatal visits as told by your health care provider. This is important. Safety  Wear your seat belt at  all times when driving.  Make a list of emergency phone numbers, including numbers for family, friends, the hospital, and police and fire departments. General instructions  Avoid cat litter boxes and soil used by cats. These carry germs that can cause birth defects in the baby. If you have a cat, ask someone to clean the litter box for you.  Do not travel far distances unless it is absolutely necessary and only with the approval of your health care provider.  Do not use hot tubs, steam rooms, or saunas.  Do not drink alcohol.  Do not use any products that contain nicotine or tobacco, such as cigarettes and e-cigarettes. If you need help quitting, ask your health care provider.  Do not use any medicinal herbs or unprescribed drugs. These chemicals affect the formation and growth of the baby.  Do not douche or use tampons or scented sanitary pads.  Do not cross your legs for long periods of time.  To prepare for the arrival of your baby: ? Take prenatal classes to understand, practice, and ask questions about labor and delivery. ? Make a trial run to the hospital. ? Visit the hospital and tour the maternity area. ? Arrange for maternity or paternity leave through employers. ? Arrange for family and friends to take care of pets while you are in the hospital. ? Purchase a rear-facing car seat and make sure you know how to install it in your car. ? Pack your hospital bag. ? Prepare the baby's nursery. Make sure to remove all pillows and stuffed animals from the baby's crib to prevent suffocation.  Visit your dentist if you have not gone during your pregnancy. Use a soft toothbrush to brush your teeth and be gentle when you floss. Contact a health care provider if:  You are unsure if you are in labor or if your water has broken.  You become dizzy.  You have mild pelvic cramps, pelvic pressure, or nagging pain in your abdominal area.  You have lower back pain.  You have persistent  nausea, vomiting, or diarrhea.  You have an unusual or bad smelling vaginal discharge.  You have pain when you urinate. Get help right away if:  Your water breaks before 37 weeks.  You have regular contractions less than 5 minutes apart before 37 weeks.  You have a fever.  You are leaking fluid from your vagina.  You have spotting or bleeding from your vagina.  You have severe abdominal pain or cramping.  You have rapid weight loss or weight gain.    You have shortness of breath with chest pain.  You notice sudden or extreme swelling of your face, hands, ankles, feet, or legs.  Your baby makes fewer than 10 movements in 2 hours.  You have severe headaches that do not go away when you take medicine.  You have vision changes. Summary  The third trimester is from week 28 through week 40, months 7 through 9. The third trimester is a time when the unborn baby (fetus) is growing rapidly.  During the third trimester, your discomfort may increase as you and your baby continue to gain weight. You may have abdominal, leg, and back pain, sleeping problems, and an increased need to urinate.  During the third trimester your breasts will keep growing and they will continue to become tender. A yellow fluid (colostrum) may leak from your breasts. This is the first milk you are producing for your baby.  False labor is a condition in which you feel small, irregular tightenings of the muscles in the womb (contractions) that eventually go away. These are called Braxton Hicks contractions. Contractions may last for hours, days, or even weeks before true labor sets in.  Signs of labor can include: abdominal cramps; regular contractions that start at 10 minutes apart and become stronger and more frequent with time; watery or bloody mucus discharge that comes from the vagina; increased pelvic pressure and dull back pain; and leaking of amniotic fluid. This information is not intended to replace advice  given to you by your health care provider. Make sure you discuss any questions you have with your health care provider. Document Released: 09/15/2001 Document Revised: 02/27/2016 Document Reviewed: 11/22/2012 Elsevier Interactive Patient Education  2017 Elsevier Inc.  

## 2018-04-12 NOTE — Progress Notes (Signed)
Pt c/o soreness in vaginal area, states part of her mucous plug came out 2-3 days ago.

## 2018-04-13 ENCOUNTER — Other Ambulatory Visit: Payer: Medicaid Other

## 2018-04-13 LAB — CBC
HEMOGLOBIN: 10.3 g/dL — AB (ref 11.1–15.9)
Hematocrit: 31.4 % — ABNORMAL LOW (ref 34.0–46.6)
MCH: 29.3 pg (ref 26.6–33.0)
MCHC: 32.8 g/dL (ref 31.5–35.7)
MCV: 89 fL (ref 79–97)
Platelets: 263 10*3/uL (ref 150–450)
RBC: 3.52 x10E6/uL — AB (ref 3.77–5.28)
RDW: 13.7 % (ref 12.3–15.4)
WBC: 9.3 10*3/uL (ref 3.4–10.8)

## 2018-04-13 LAB — RPR: RPR Ser Ql: NONREACTIVE

## 2018-04-13 LAB — GLUCOSE TOLERANCE, 1 HOUR: GLUCOSE, 1HR PP: 75 mg/dL (ref 65–199)

## 2018-04-13 LAB — HIV ANTIBODY (ROUTINE TESTING W REFLEX): HIV Screen 4th Generation wRfx: NONREACTIVE

## 2018-04-14 LAB — CERVICOVAGINAL ANCILLARY ONLY
BACTERIAL VAGINITIS: NEGATIVE
Candida vaginitis: NEGATIVE
Chlamydia: NEGATIVE
NEISSERIA GONORRHEA: NEGATIVE
Trichomonas: NEGATIVE

## 2018-04-26 ENCOUNTER — Ambulatory Visit (INDEPENDENT_AMBULATORY_CARE_PROVIDER_SITE_OTHER): Payer: Self-pay | Admitting: Advanced Practice Midwife

## 2018-04-26 ENCOUNTER — Encounter: Payer: Self-pay | Admitting: Advanced Practice Midwife

## 2018-04-26 VITALS — BP 102/58 | HR 69 | Wt 139.6 lb

## 2018-04-26 DIAGNOSIS — Z3483 Encounter for supervision of other normal pregnancy, third trimester: Secondary | ICD-10-CM

## 2018-04-26 DIAGNOSIS — Z3480 Encounter for supervision of other normal pregnancy, unspecified trimester: Secondary | ICD-10-CM

## 2018-04-26 NOTE — Patient Instructions (Signed)

## 2018-04-26 NOTE — Progress Notes (Signed)
   PRENATAL VISIT NOTE  Subjective:  Christine Lambert is a 22 y.o. G2P1001 at [redacted]w[redacted]d being seen today for ongoing prenatal care.  She is currently monitored for the following issues for this low-risk pregnancy and has Traumatic injury during pregnancy; Supervision of other normal pregnancy, antepartum; Arthritis; History of migraine headaches; UTI in pregnancy, antepartum, third trimester; and Limited prenatal care in second trimester on their problem list.  Patient reports no complaints.  Contractions: Not present. Vag. Bleeding: None.  Movement: Present. Denies leaking of fluid.   The following portions of the patient's history were reviewed and updated as appropriate: allergies, current medications, past family history, past medical history, past social history, past surgical history and problem list. Problem list updated.  Objective:   Vitals:   04/26/18 1318  BP: (!) 102/58  Pulse: 69  Weight: 139 lb 9.6 oz (63.3 kg)    Fetal Status: Fetal Heart Rate (bpm): 154 Fundal Height: 34 cm Movement: Present     General:  Alert, oriented and cooperative. Patient is in no acute distress.  Skin: Skin is warm and dry. No rash noted.   Cardiovascular: Normal heart rate noted  Respiratory: Normal respiratory effort, no problems with respiration noted  Abdomen: Soft, gravid, appropriate for gestational age.  Pain/Pressure: Present     Pelvic: Cervical exam deferred        Extremities: Normal range of motion.  Edema: Trace  Mental Status: Normal mood and affect. Normal behavior. Normal judgment and thought content.   Assessment and Plan:  Pregnancy: G2P1001 at [redacted]w[redacted]d  1. Supervision of other normal pregnancy, antepartum - Routine care - Culture, OB Urine - US MFM OB FOLLOW UP; Future  Preterm labor symptoms and general obstetric precautions including but not limited to vaginal bleeding, contractions, leaking of fluid and fetal movement were reviewed in detail with the patient. Please  refer to After Visit Summary for other counseling recommendations.  Return in about 2 weeks (around 05/10/2018).  Future Appointments  Date Time Provider Department Center  05/03/2018  9:15 AM [facility] US 4 [facility]  05/10/2018  4:15 PM Marylene LandKooistra, Kathryn Lorraine, CNM [facility]  05/17/2018  4:15 PM Rolm BookbinderNeill, Caroline M, CNM [facility]  05/24/2018  4:15 PM Crisoforo OxfordKooistra, Charlesetta GaribaldiKathryn Lorraine, CNM [facility]  05/31/2018  4:20 PM Tamera StandsWallace, Laurel S, DO [facility]    Thressa ShellerHeather Daveon Arpino, CNM

## 2018-04-26 NOTE — Progress Notes (Signed)
Subjective:  Christine Lambert is a 22 y.o. G2P1001 at 8153w6d being seen today for ongoing prenatal care.  She is currently monitored for the following issues for this low-risk pregnancy and has Traumatic injury during pregnancy; Supervision of other normal pregnancy, antepartum; Arthritis; History of migraine headaches; UTI in pregnancy, antepartum, third trimester; and Limited prenatal care in second trimester on their problem list.  Patient reports backache and some pelvic pressure.  Contractions: Not present. Vag. Bleeding: None.  Movement: Present. Denies leaking of fluid. Endorses some occasional vaginal discharge.  The following portions of the patient's history were reviewed and updated as appropriate: allergies, current medications, past family history, past medical history, past social history, past surgical history and problem list. Problem list updated.  Objective:   Vitals:   04/26/18 1318  BP: (!) 102/58  Pulse: 69  Weight: 139 lb 9.6 oz (63.3 kg)    Fetal Status: Fetal Heart Rate (bpm): 154 Fundal Height: 34 cm Movement: Present      General:  Alert, oriented and cooperative. Patient is in no acute distress.  Skin: Skin is warm and dry. No rash noted.   Cardiovascular: Normal heart rate noted  Respiratory: Normal respiratory effort, no problems with respiration noted  Abdomen: Soft, gravid, appropriate for gestational age. Pain/Pressure: Present     Pelvic: Vag. Bleeding: None     Cervical Exam not performed        Extremities: Normal range of motion.  Edema: Trace  Mental Status: Normal mood and affect. Normal behavior. Normal judgment and thought content.     Assessment and Plan:  Pregnancy: G2P1001 at 4953w6d  1. Supervision of other normal pregnancy, antepartum -Patient is doing well overall  -Patient has not decided on pediatrician yet but was given a list -Culture, OB Urine -US MFM OB FOLLOW UP  Preterm labor symptoms and general obstetric precautions  including but not limited to vaginal bleeding, contractions, leaking of fluid and fetal movement were reviewed in detail with the patient. Please refer to After Visit Summary for other counseling recommendations.  Return in about 2 weeks (around 05/10/2018).   Candie MileMitchell, Robie Oats R, [place]

## 2018-04-29 LAB — CULTURE, OB URINE

## 2018-04-29 LAB — URINE CULTURE, OB REFLEX

## 2018-05-03 ENCOUNTER — Ambulatory Visit (HOSPITAL_COMMUNITY)
Admission: RE | Admit: 2018-05-03 | Discharge: 2018-05-03 | Disposition: A | Discharge status: Home / Self Care | Payer: Medicaid Other | Source: Ambulatory Visit | Attending: Advanced Practice Midwife | Admitting: Advanced Practice Midwife

## 2018-05-03 ENCOUNTER — Other Ambulatory Visit: Payer: Self-pay | Admitting: Advanced Practice Midwife

## 2018-05-03 DIAGNOSIS — IMO0002 Reserved for concepts with insufficient information to code with codable children: Secondary | ICD-10-CM

## 2018-05-03 DIAGNOSIS — Z3480 Encounter for supervision of other normal pregnancy, unspecified trimester: Secondary | ICD-10-CM

## 2018-05-03 DIAGNOSIS — Z3A34 34 weeks gestation of pregnancy: Secondary | ICD-10-CM | POA: Diagnosis not present

## 2018-05-03 DIAGNOSIS — Z0489 Encounter for examination and observation for other specified reasons: Secondary | ICD-10-CM

## 2018-05-03 DIAGNOSIS — Z3483 Encounter for supervision of other normal pregnancy, third trimester: Secondary | ICD-10-CM | POA: Insufficient documentation

## 2018-05-03 DIAGNOSIS — Z362 Encounter for other antenatal screening follow-up: Secondary | ICD-10-CM

## 2018-05-05 MED ORDER — CEPHALEXIN 500 MG PO CAPS: 500.0000 mg | ORAL_CAPSULE | Freq: Three times a day (TID) | ORAL | 0 refills | Status: DC

## 2018-05-05 NOTE — Addendum Note (Signed)
Addended by: Thressa ShellerHOGAN, Gricelda Foland D on: 05/05/2018 01:37 PM   Modules accepted: Orders

## 2018-05-10 ENCOUNTER — Ambulatory Visit (INDEPENDENT_AMBULATORY_CARE_PROVIDER_SITE_OTHER): Payer: Self-pay | Admitting: Student

## 2018-05-10 ENCOUNTER — Telehealth: Payer: Self-pay | Admitting: General Practice

## 2018-05-10 VITALS — BP 99/59 | HR 82 | Wt 144.0 lb

## 2018-05-10 DIAGNOSIS — O2343 Unspecified infection of urinary tract in pregnancy, third trimester: Secondary | ICD-10-CM

## 2018-05-10 DIAGNOSIS — Z3480 Encounter for supervision of other normal pregnancy, unspecified trimester: Secondary | ICD-10-CM

## 2018-05-10 NOTE — Telephone Encounter (Signed)
-----   Message from Christine ReichertHeather D Hogan, CNM sent at 05/05/2018  1:36 PM EDT ----- Patient with UTI. I have sent in rx. Please call her. Thank you, Herbert SetaHeather

## 2018-05-10 NOTE — Telephone Encounter (Signed)
Called patient, no answer- left message on voicemail stating we are trying to reach you to inform you your most recent urine culture shows you have a urinary tract infection. We have sent an antibiotic to [facility]. Please pick up this prescription and take TID x 1 week. You may call us back if you have questions.

## 2018-05-10 NOTE — Patient Instructions (Signed)

## 2018-05-10 NOTE — Progress Notes (Signed)
   PRENATAL VISIT NOTE  Subjective:  Christine Lambert is a 22 y.o. G2P1001 at 7636w6d being seen today for ongoing prenatal care.  She is currently monitored for the following issues for this low-risk pregnancy and has Traumatic injury during pregnancy; Supervision of other normal pregnancy, antepartum; Arthritis; History of migraine headaches; UTI in pregnancy, antepartum, third trimester; and Limited prenatal care in second trimester on their problem list.  Patient reports backache occasional that she thinks is due to her belly pulling forward. Denies any s/s of UTI today. .  Contractions: Not present. Vag. Bleeding: None.  Movement: Present. Denies leaking of fluid.   The following portions of the patient's history were reviewed and updated as appropriate: allergies, current medications, past family history, past medical history, past social history, past surgical history and problem list. Problem list updated.  Objective:   Vitals:   05/10/18 1617  BP: (!) 99/59  Pulse: 82  Weight: 144 lb (65.3 kg)    Fetal Status: Fetal Heart Rate (bpm): 154 Fundal Height: 35 cm Movement: Present     General:  Alert, oriented and cooperative. Patient is in no acute distress.  Skin: Skin is warm and dry. No rash noted.   Cardiovascular: Normal heart rate noted  Respiratory: Normal respiratory effort, no problems with respiration noted  Abdomen: Soft, gravid, appropriate for gestational age.  Pain/Pressure: Present     Pelvic: Cervical exam deferred        Extremities: Normal range of motion.  Edema: Trace  Mental Status: Normal mood and affect. Normal behavior. Normal judgment and thought content.   Assessment and Plan:  Pregnancy: G2P1001 at 3836w6d  1. Supervision of other normal pregnancy, antepartum -Reviewed GBS and GC chlamydia tests in preparation for 36 week visit.   2. UTI in pregnancy, antepartum, third trimester -Patient's TOC was positive for e.coli on 7-25 but patient did not  finish her medicine from May 28. RX for Keflex given today. Needs TOC in 4 weeks.   Preterm labor symptoms and general obstetric precautions including but not limited to vaginal bleeding, contractions, leaking of fluid and fetal movement were reviewed in detail with the patient. Please refer to After Visit Summary for other counseling recommendations.  Return in about 1 week (around 05/17/2018).  Future Appointments  Date Time Provider Department Center  05/17/2018  4:15 PM Rennie Plowmaneill, [facility] M[facility]  05/24/2018  4:15 PM Crisoforo OxfordKooistra, Charlesetta GaribaldiKathryn Lorraine, CNM [facility]  05/31/2018  4:20 PM Tamera StandsWallace, Laurel S, DO [facility]    Marylene LandKathryn Lorraine Deven Furia, [place]

## 2018-05-17 ENCOUNTER — Ambulatory Visit (INDEPENDENT_AMBULATORY_CARE_PROVIDER_SITE_OTHER): Payer: Self-pay

## 2018-05-17 ENCOUNTER — Other Ambulatory Visit (HOSPITAL_COMMUNITY)
Admission: RE | Admit: 2018-05-17 | Discharge: 2018-05-17 | Disposition: A | Discharge status: Home / Self Care | Payer: Medicaid Other | Source: Ambulatory Visit

## 2018-05-17 VITALS — BP 99/63 | HR 84 | Wt 145.0 lb

## 2018-05-17 DIAGNOSIS — Z3483 Encounter for supervision of other normal pregnancy, third trimester: Secondary | ICD-10-CM | POA: Diagnosis not present

## 2018-05-17 DIAGNOSIS — O2343 Unspecified infection of urinary tract in pregnancy, third trimester: Secondary | ICD-10-CM

## 2018-05-17 DIAGNOSIS — Z3480 Encounter for supervision of other normal pregnancy, unspecified trimester: Secondary | ICD-10-CM

## 2018-05-17 NOTE — Patient Instructions (Signed)

## 2018-05-17 NOTE — Progress Notes (Signed)
   PRENATAL VISIT NOTE  Subjective:  Christine Lambert is a 22 y.o. G2P1001 at 6370w6d being seen today for ongoing prenatal care.  She is currently monitored for the following issues for this low-risk pregnancy and has Traumatic injury during pregnancy; Supervision of other normal pregnancy, antepartum; Arthritis; History of migraine headaches; UTI in pregnancy, antepartum, third trimester; and Limited prenatal care in second trimester on their problem list.  Patient reports no complaints.  Contractions: Not present. Vag. Bleeding: None.  Movement: Present. Denies leaking of fluid.   The following portions of the patient's history were reviewed and updated as appropriate: allergies, current medications, past family history, past medical history, past social history, past surgical history and problem list. Problem list updated.  Objective:   Vitals:   05/17/18 1628  BP: 99/63  Pulse: 84  Weight: 145 lb (65.8 kg)    Fetal Status: Fetal Heart Rate (bpm): 154 Fundal Height: 36 cm Movement: Present     General:  Alert, oriented and cooperative. Patient is in no acute distress.  Skin: Skin is warm and dry. No rash noted.   Cardiovascular: Normal heart rate noted  Respiratory: Normal respiratory effort, no problems with respiration noted  Abdomen: Soft, gravid, appropriate for gestational age.  Pain/Pressure: Present     Pelvic: Cervical exam performed Dilation: Closed Effacement (%): 50 Station: Ballotable  Extremities: Normal range of motion.  Edema: Trace  Mental Status: Normal mood and affect. Normal behavior. Normal judgment and thought content.   Assessment and Plan:  Pregnancy: G2P1001 at 3470w6d  1. Supervision of other normal pregnancy, antepartum - Culture, beta strep (group b only) - [facility]lam[facility]  2. UTI in pregnancy, antepartum, third trimester  - patient has not yet started prescription. She states she will fill and take as directed  (Keflex, 500mg , TID, for one week)  - TOC in 3 weeks  Term labor symptoms and general obstetric precautions including but not limited to vaginal bleeding, contractions, leaking of fluid and fetal movement were reviewed in detail with the patient. Please refer to After Visit Summary for other counseling recommendations.  Return in about 1 week (around 05/24/2018) for Return OB visit.  Future Appointments  Date Time Provider Department Center  05/24/2018  4:15 PM Madlyn FrankelKooistra, Kathryn Lorraine, [facility]M [facility]  05/31/2018  4:20 PM Tamera StandsWallace, Laurel S, DO [facility]    Margarita RanaHarrison D Neel Buffone, [place] 4:43PM

## 2018-05-18 LAB — GC/CHLAMYDIA PROBE AMP (~~LOC~~) NOT AT ARMC
Chlamydia: NEGATIVE
Neisseria Gonorrhea: NEGATIVE

## 2018-05-21 LAB — CULTURE, BETA STREP (GROUP B ONLY): Strep Gp B Culture: NEGATIVE

## 2018-05-24 ENCOUNTER — Ambulatory Visit (INDEPENDENT_AMBULATORY_CARE_PROVIDER_SITE_OTHER): Payer: Self-pay | Admitting: Student

## 2018-05-24 DIAGNOSIS — Z3480 Encounter for supervision of other normal pregnancy, unspecified trimester: Secondary | ICD-10-CM

## 2018-05-24 DIAGNOSIS — O2343 Unspecified infection of urinary tract in pregnancy, third trimester: Secondary | ICD-10-CM

## 2018-05-24 NOTE — Patient Instructions (Signed)

## 2018-05-24 NOTE — Progress Notes (Addendum)
   PRENATAL VISIT NOTE  Subjective:  Jeri ModenaMichaela Schnebly is a 22 y.o. G2P1001 at 2755w6d being seen today for ongoing prenatal care.  She is currently monitored for the following issues for this low-risk pregnancy and has Traumatic injury during pregnancy; Supervision of other normal pregnancy, antepartum; Arthritis; History of migraine headaches; UTI in pregnancy, antepartum, third trimester; and Limited prenatal care in second trimester on their problem list.  Patient reports no complaints.  Contractions: Irritability. Vag. Bleeding: None.  Movement: Present. Denies leaking of fluid.   The following portions of the patient's history were reviewed and updated as appropriate: allergies, current medications, past family history, past medical history, past social history, past surgical history and problem list. Problem list updated.  Objective:   Vitals:   05/24/18 1625  BP: (!) 94/58  Pulse: 72    Fetal Status: Fetal Heart Rate (bpm): 146 Fundal Height: 36 cm Movement: Present     General:  Alert, oriented and cooperative. Patient is in no acute distress.  Skin: Skin is warm and dry. No rash noted.   Cardiovascular: Normal heart rate noted  Respiratory: Normal respiratory effort, no problems with respiration noted  Abdomen: Soft, gravid, appropriate for gestational age.  Pain/Pressure: Present     Pelvic: Cervical exam deferred        Extremities: Normal range of motion.  Edema: Trace  Mental Status: Normal mood and affect. Normal behavior. Normal judgment and thought content.   Assessment and Plan:  Pregnancy: G2P1001 at 555w6d  1. Supervision of other normal pregnancy, antepartum  - reviewed labor symptoms and plan for delivery  - f/u 1 week  2. UTI in pregnancy, antepartum, third trimester  - completed Keflex course  - TOC 2 weeks  Term labor symptoms and general obstetric precautions including but not limited to vaginal bleeding, contractions, leaking of fluid and fetal  movement were reviewed in detail with the patient. Please refer to After Visit Summary for other counseling recommendations.  Return in about 1 week (around 05/31/2018).  Future Appointments  Date Time Provider Department Center  05/31/2018  4:20 PM Tamera StandsWallace, Laurel S, DO [facility]    Margarita RanaHarrison D Kendi Defalco, Student-PA 4:45pm   I confirm that I have verified the information documented in the PA students's note and that I have also personally reperformed the physical exam and all medical decision making activities.   Patient doing well; finished course of antibiotics last week so will re-test in 3 weeks if not delivered. FH is 36 today; will reassess next week.   Luna KitchensKathryn Kooistra

## 2018-05-31 ENCOUNTER — Encounter: Payer: Self-pay | Admitting: Family Medicine

## 2018-05-31 ENCOUNTER — Ambulatory Visit (INDEPENDENT_AMBULATORY_CARE_PROVIDER_SITE_OTHER): Payer: Medicaid Other | Admitting: Family Medicine

## 2018-05-31 VITALS — BP 90/60 | HR 76 | Wt 147.4 lb

## 2018-05-31 DIAGNOSIS — O99891 Other specified diseases and conditions complicating pregnancy: Secondary | ICD-10-CM

## 2018-05-31 DIAGNOSIS — Z3480 Encounter for supervision of other normal pregnancy, unspecified trimester: Secondary | ICD-10-CM

## 2018-05-31 DIAGNOSIS — O9989 Other specified diseases and conditions complicating pregnancy, childbirth and the puerperium: Secondary | ICD-10-CM | POA: Diagnosis not present

## 2018-05-31 DIAGNOSIS — R8271 Bacteriuria: Secondary | ICD-10-CM | POA: Diagnosis not present

## 2018-05-31 DIAGNOSIS — Z3483 Encounter for supervision of other normal pregnancy, third trimester: Secondary | ICD-10-CM

## 2018-05-31 NOTE — Patient Instructions (Signed)
Asymptomatic Bacteriuria Asymptomatic bacteriuria is the presence of a large number of bacteria in the urine without the usual symptoms of burning or frequent urination. What are the causes? This condition is caused by an increase in bacteria in the urine. This increase can be caused by:  Bacteria entering the urinary tract, such as during sex.  A blockage in the urinary tract, such as from kidney stones or a tumor.  Bladder problems that prevent the bladder from emptying.  What increases the risk? You are more likely to develop this condition if:  You have diabetes mellitus.  You are an elderly adult, especially if you are also in a long-term care facility.  You are pregnant and in the first trimester.  You have kidney stones.  You are female.  You have had a kidney transplant.  You have a leaky kidney tube valve (reflux).  You had a urinary catheter for a long period of time.  What are the signs or symptoms? There are no symptoms of this condition. How is this diagnosed? This condition is diagnosed with a urine test. Because this condition does not cause symptoms, it is usually diagnosed when a urine sample is taken to treat or diagnose another condition, such as pregnancy or kidney problems. Most women who are in their first trimester of pregnancy are screened for asymptomatic bacteriuria. How is this treated? Usually, treatment is not needed for this condition. Treating the condition can lead to other problems, such as a yeast infection or the growth of bacteria that do not respond to treatment (antibiotic-resistant bacteria). Some people, such as pregnant women and people with kidney transplants, do need treatment with antibiotic medicines to prevent kidney infection (pyelonephritis). In pregnant women, kidney infection can lead to premature labor, fetal growth restriction, or newborn death. Follow these instructions at home: Medicines  Take over-the-counter and  prescription medicines only as told by your health care provider.  If you were prescribed an antibiotic medicine, take it as told by your health care provider. Do not stop taking the antibiotic even if you start to feel better. General instructions  Monitor your condition for any changes.  Drink enough fluid to keep your urine clear or pale yellow.  Go to the bathroom more often to keep your bladder empty.  If you are female, keep the area around your vagina and rectum clean. Wipe yourself from front to back after urinating.  Keep all follow-up visits as told by your health care provider. This is important. Contact a health care provider if:  You notice any new symptoms, such as back pain or burning while urinating. Get help right away if:  You develop signs of an infection such as: ? A burning sensation when you urinate. ? Have pain when you urinate. ? Develop an intense need to urinate. ? Urinating more frequently. ? Back pain or pelvic pain. ? Fever or chills.  You have blood in your urine.  Your urine becomes discolored or cloudy.  Your urine smells bad.  You have severe pain that cannot be controlled with medicine. Summary  Asymptomatic bacteriuria is the presence of a large number of bacteria in the urine without the usual symptoms of burning or frequent urination.  Usually, treatment is not needed for this condition. Treating the condition can lead to other problems, such as too much yeast and the growth of antibiotic-resistant bacteria.  Some people, such as pregnant women and people with kidney transplants, do need treatment with antibiotic medicines to prevent   kidney infection (pyelonephritis).  If you were prescribed an antibiotic medicine, take it as told by your health care provider. Do not stop taking the antibiotic even if you start to feel better. This information is not intended to replace advice given to you by your health care provider. Make sure you  discuss any questions you have with your health care provider. Document Released: 09/21/2005 Document Revised: 09/15/2016 Document Reviewed: 09/15/2016 Elsevier Interactive Patient Education  2017 [employer]  Vaginal Delivery Vaginal delivery means that you will give birth by pushing your baby out of your birth canal (vagina). A team of health care providers will help you before, during, and after vaginal delivery. Birth experiences are unique for every woman and every pregnancy, and birth experiences vary depending on where you choose to give birth. What should I do to prepare for my baby's birth? Before your baby is born, it is important to talk with your health care provider about:  Your labor and delivery preferences. These may include: ? Medicines that you may be given. ? How you will manage your pain. This might include non-medical pain relief techniques or injectable pain relief such as epidural analgesia. ? How you and your baby will be monitored during labor and delivery. ? Who may be in the labor and delivery room with you. ? Your feelings about surgical delivery of your baby (cesarean delivery, or C-section) if this becomes necessary. ? Your feelings about receiving donated blood through an IV tube (blood transfusion) if this becomes necessary.  Whether you are able: ? To take pictures or videos of the birth. ? To eat during labor and delivery. ? To move around, walk, or change positions during labor and delivery.  What to expect after your baby is born, such as: ? Whether delayed umbilical cord clamping and cutting is offered. ? Who will care for your baby right after birth. ? Medicines or tests that may be recommended for your baby. ? Whether breastfeeding is supported in your hospital or birth center. ? How long you will be in the hospital or birth center.  How any medical conditions you have may affect your baby or your labor and delivery experience.  To prepare for  your baby's birth, you should also:  Attend all of your health care visits before delivery (prenatal visits) as recommended by your health care provider. This is important.  Prepare your home for your baby's arrival. Make sure that you have: ? Diapers. ? Baby clothing. ? Feeding equipment. ? Safe sleeping arrangements for you and your baby.  Install a car seat in your vehicle. Have your car seat checked by a certified car seat installer to make sure that it is installed safely.  Think about who will help you with your new baby at home for at least the first several weeks after delivery.  What can I expect when I arrive at the birth center or hospital? Once you are in labor and have been admitted into the hospital or birth center, your health care provider may:  Review your pregnancy history and any concerns you have.  Insert an IV tube into one of your veins. This is used to give you fluids and medicines.  Check your blood pressure, pulse, temperature, and heart rate (vital signs).  Check whether your bag of water (amniotic sac) has broken (ruptured).  Talk with you about your birth plan and discuss pain control options.  Monitoring Your health care provider may monitor your contractions (uterine monitoring)  and your baby's heart rate (fetal monitoring). You may need to be monitored:  Often, but not continuously (intermittently).  All the time or for long periods at a time (continuously). Continuous monitoring may be needed if: ? You are taking certain medicines, such as medicine to relieve pain or make your contractions stronger. ? You have pregnancy or labor complications.  Monitoring may be done by:  Placing a special stethoscope or a handheld monitoring device on your abdomen to check your baby's heartbeat, and feeling your abdomen for contractions. This method of monitoring does not continuously record your baby's heartbeat or your contractions.  Placing monitors on your  abdomen (external monitors) to record your baby's heartbeat and the frequency and length of contractions. You may not have to wear external monitors all the time.  Placing monitors inside of your uterus (internal monitors) to record your baby's heartbeat and the frequency, length, and strength of your contractions. ? Your health care provider may use internal monitors if he or she needs more information about the strength of your contractions or your baby's heart rate. ? Internal monitors are put in place by passing a thin, flexible wire through your vagina and into your uterus. Depending on the type of monitor, it may remain in your uterus or on your baby's head until birth. ? Your health care provider will discuss the benefits and risks of internal monitoring with you and will ask for your permission before inserting the monitors.  Telemetry. This is a type of continuous monitoring that can be done with external or internal monitors. Instead of having to stay in bed, you are able to move around during telemetry. Ask your health care provider if telemetry is an option for you.  Physical exam Your health care provider may perform a physical exam. This may include:  Checking whether your baby is positioned: ? With the head toward your vagina (head-down). This is most common. ? With the head toward the top of your uterus (head-up or breech). If your baby is in a breech position, your health care provider may try to turn your baby to a head-down position so you can deliver vaginally. If it does not seem that your baby can be born vaginally, your provider may recommend surgery to deliver your baby. In rare cases, you may be able to deliver vaginally if your baby is head-up (breech delivery). ? Lying sideways (transverse). Babies that are lying sideways cannot be delivered vaginally.  Checking your cervix to determine: ? Whether it is thinning out (effacing). ? Whether it is opening up  (dilating). ? How low your baby has moved into your birth canal.  What are the three stages of labor and delivery?  Normal labor and delivery is divided into the following three stages: Stage 1  Stage 1 is the longest stage of labor, and it can last for hours or days. Stage 1 includes: ? Early labor. This is when contractions may be irregular, or regular and mild. Generally, early labor contractions are more than 10 minutes apart. ? Active labor. This is when contractions get longer, more regular, more frequent, and more intense. ? The transition phase. This is when contractions happen very close together, are very intense, and may last longer than during any other part of labor.  Contractions generally feel mild, infrequent, and irregular at first. They get stronger, more frequent (about every 2-3 minutes), and more regular as you progress from early labor through active labor and transition.  Many women  progress through stage 1 naturally, but you may need help to continue making progress. If this happens, your health care provider may talk with you about: ? Rupturing your amniotic sac if it has not ruptured yet. ? Giving you medicine to help make your contractions stronger and more frequent.  Stage 1 ends when your cervix is completely dilated to 4 inches (10 cm) and completely effaced. This happens at the end of the transition phase. Stage 2  Once your cervix is completely effaced and dilated to 4 inches (10 cm), you may start to feel an urge to push. It is common for the body to naturally take a rest before feeling the urge to push, especially if you received an epidural or certain other pain medicines. This rest period may last for up to 1-2 hours, depending on your unique labor experience.  During stage 2, contractions are generally less painful, because pushing helps relieve contraction pain. Instead of contraction pain, you may feel stretching and burning pain, especially when the  widest part of your baby's head passes through the vaginal opening (crowning).  Your health care provider will closely monitor your pushing progress and your baby's progress through the vagina during stage 2.  Your health care provider may massage the area of skin between your vaginal opening and anus (perineum) or apply warm compresses to your perineum. This helps it stretch as the baby's head starts to crown, which can help prevent perineal tearing. ? In some cases, an incision may be made in your perineum (episiotomy) to allow the baby to pass through the vaginal opening. An episiotomy helps to make the opening of the vagina larger to allow more room for the baby to fit through.  It is very important to breathe and focus so your health care provider can control the delivery of your baby's head. Your health care provider may have you decrease the intensity of your pushing, to help prevent perineal tearing.  After delivery of your baby's head, the shoulders and the rest of the body generally deliver very quickly and without difficulty.  Once your baby is delivered, the umbilical cord may be cut right away, or this may be delayed for 1-2 minutes, depending on your baby's health. This may vary among health care providers, hospitals, and birth centers.  If you and your baby are healthy enough, your baby may be placed on your chest or abdomen to help maintain the baby's temperature and to help you bond with each other. Some mothers and babies start breastfeeding at this time. Your health care team will dry your baby and help keep your baby warm during this time.  Your baby may need immediate care if he or she: ? Showed signs of distress during labor. ? Has a medical condition. ? Was born too early (prematurely). ? Had a bowel movement before birth (meconium). ? Shows signs of difficulty transitioning from being inside the uterus to being outside of the uterus. If you are planning to breastfeed,  your health care team will help you begin a feeding. Stage 3  The third stage of labor starts immediately after the birth of your baby and ends after you deliver the placenta. The placenta is an organ that develops during pregnancy to provide oxygen and nutrients to your baby in the womb.  Delivering the placenta may require some pushing, and you may have mild contractions. Breastfeeding can stimulate contractions to help you deliver the placenta.  After the placenta is delivered, your uterus  should tighten (contract) and become firm. This helps to stop bleeding in your uterus. To help your uterus contract and to control bleeding, your health care provider may: ? Give you medicine by injection, through an IV tube, by mouth, or through your rectum (rectally). ? Massage your abdomen or perform a vaginal exam to remove any blood clots that are left in your uterus. ? Empty your bladder by placing a thin, flexible tube (catheter) into your bladder. ? Encourage you to breastfeed your baby. After labor is over, you and your baby will be monitored closely to ensure that you are both healthy until you are ready to go home. Your health care team will teach you how to care for yourself and your baby. This information is not intended to replace advice given to you by your health care provider. Make sure you discuss any questions you have with your health care provider. Document Released: 06/30/2008 Document Revised: 04/10/2016 Document Reviewed: 10/06/2015 Elsevier Interactive Patient Education  2018 [employer]

## 2018-05-31 NOTE — Progress Notes (Signed)
   PRENATAL VISIT NOTE Subjective:  Christine Lambert is a 22 y.o. G2P1001 at 379w6d being seen today for ongoing prenatal care.  She is currently monitored for the following issues for this low-risk pregnancy and has Traumatic injury during pregnancy; Supervision of other normal pregnancy, antepartum; Arthritis; History of migraine headaches; Asymptomatic bacteriuria during pregnancy; and Limited prenatal care in second trimester on their problem list.  Patient reports "done being pregnant" but no other complaints.  Contractions: Not present. Vag. Bleeding: None.  Movement: Present. Denies leaking of fluid.   The following portions of the patient's history were reviewed and updated as appropriate: allergies, current medications, past family history, past medical history, past social history, past surgical history and problem list. Problem list updated.  Objective:   Vitals:   05/31/18 1637  BP: 90/60  Pulse: 76  Weight: 147 lb 6.4 oz (66.9 kg)    Fetal Status: Fetal Heart Rate (bpm): 146   Movement: Present     General:  Alert, oriented and cooperative. Patient is in no acute distress.  Skin: Skin is warm and dry. No rash noted.   Cardiovascular: Normal heart rate noted  Respiratory: Normal respiratory effort, no problems with respiration noted  Abdomen: Soft, gravid, appropriate for gestational age.  Pain/Pressure: Present     Pelvic: Cervical exam deferred        Extremities: Normal range of motion.  Edema: None  Mental Status: Normal mood and affect. Normal behavior. Normal judgment and thought content.   Assessment and Plan:  Pregnancy: G2P1001 at 539w6d  1. Supervision of other normal pregnancy, antepartum - discussed considering membrane sweeping at follow-up visit - discussed process for induction at 41 weeks prn   2. Asymptomatic bacteriuria during pregnancy - See overview of problem list. Was treated with 2nd course of antibiotics recently but urine culture with  resistance to Keflex which she was prescribed. Will follow-up this urine culture and decide on subsequent management. Patient has never been symptomatic, counseled on asymptomatic bacteriuria in pregnancy.  - Urine Culture  Term labor symptoms and general obstetric precautions including but not limited to vaginal bleeding, contractions, leaking of fluid and fetal movement were reviewed in detail with the patient.  Please refer to After Visit Summary for other counseling recommendations.  Return in about 1 week (around 06/07/2018).  Tamera StandsLaurel S Joyceline Maiorino, DO

## 2018-06-01 ENCOUNTER — Telehealth: Payer: Self-pay | Admitting: Family Medicine

## 2018-06-01 NOTE — Telephone Encounter (Signed)
Called and left a voicemail for patient with her next OB appointment on 06/08/18 @ 9:35

## 2018-06-02 LAB — URINE CULTURE

## 2018-06-05 ENCOUNTER — Inpatient Hospital Stay (HOSPITAL_COMMUNITY)
Admission: EM | Admit: 2018-06-05 | Discharge: 2018-06-07 | DRG: 807 | Disposition: A | Discharge status: Home / Self Care | Payer: Medicaid Other | Attending: Obstetrics & Gynecology | Admitting: Obstetrics & Gynecology

## 2018-06-05 ENCOUNTER — Encounter (HOSPITAL_COMMUNITY): Payer: Self-pay

## 2018-06-05 ENCOUNTER — Other Ambulatory Visit: Payer: Self-pay

## 2018-06-05 DIAGNOSIS — O26893 Other specified pregnancy related conditions, third trimester: Secondary | ICD-10-CM | POA: Diagnosis present

## 2018-06-05 DIAGNOSIS — Z3483 Encounter for supervision of other normal pregnancy, third trimester: Secondary | ICD-10-CM | POA: Diagnosis present

## 2018-06-05 DIAGNOSIS — Z3A39 39 weeks gestation of pregnancy: Secondary | ICD-10-CM | POA: Diagnosis not present

## 2018-06-05 DIAGNOSIS — R109 Unspecified abdominal pain: Secondary | ICD-10-CM | POA: Diagnosis present

## 2018-06-05 DIAGNOSIS — Z87891 Personal history of nicotine dependence: Secondary | ICD-10-CM | POA: Diagnosis not present

## 2018-06-05 LAB — CBC
HCT: 35.1 % — ABNORMAL LOW (ref 36.0–46.0)
Hemoglobin: 11.7 g/dL — ABNORMAL LOW (ref 12.0–15.0)
MCH: 29.5 pg (ref 26.0–34.0)
MCHC: 33.3 g/dL (ref 30.0–36.0)
MCV: 88.6 fL (ref 78.0–100.0)
Platelets: 302 10*3/uL (ref 150–400)
RBC: 3.96 MIL/uL (ref 3.87–5.11)
RDW: 13.8 % (ref 11.5–15.5)
WBC: 14.5 10*3/uL — ABNORMAL HIGH (ref 4.0–10.5)

## 2018-06-05 LAB — TYPE AND SCREEN
ABO/RH(D): B POS
Antibody Screen: NEGATIVE

## 2018-06-05 MED ORDER — SODIUM CHLORIDE 0.9% FLUSH: 3.0000 mL | INTRAVENOUS | Status: DC | PRN

## 2018-06-05 MED ORDER — IBUPROFEN 600 MG PO TABS
600.0000 mg | ORAL_TABLET | Freq: Four times a day (QID) | ORAL | Status: DC
  Administered 2018-06-05 – 2018-06-07 (×8): 600 mg via ORAL
  Filled 2018-06-05 (×8): qty 1

## 2018-06-05 MED ORDER — ACETAMINOPHEN 325 MG PO TABS
650.0000 mg | ORAL_TABLET | ORAL | Status: DC | PRN
  Administered 2018-06-06: 650 mg via ORAL

## 2018-06-05 MED ORDER — OXYTOCIN 40 UNITS IN LACTATED RINGERS INFUSION - SIMPLE MED: 2.5000 [IU]/h | INTRAVENOUS | Status: DC

## 2018-06-05 MED ORDER — WITCH HAZEL-GLYCERIN EX PADS: 1.0000 "application " | MEDICATED_PAD | CUTANEOUS | Status: DC | PRN

## 2018-06-05 MED ORDER — COCONUT OIL OIL
1.0000 "application " | TOPICAL_OIL | Status: DC | PRN
  Filled 2018-06-05: qty 120

## 2018-06-05 MED ORDER — ONDANSETRON HCL 4 MG/2ML IJ SOLN: 4.0000 mg | Freq: Four times a day (QID) | INTRAMUSCULAR | Status: DC | PRN

## 2018-06-05 MED ORDER — OXYCODONE-ACETAMINOPHEN 5-325 MG PO TABS
1.0000 | ORAL_TABLET | ORAL | Status: DC | PRN
  Administered 2018-06-06 – 2018-06-07 (×4): 1 via ORAL
  Filled 2018-06-05 (×4): qty 1

## 2018-06-05 MED ORDER — OXYCODONE-ACETAMINOPHEN 5-325 MG PO TABS: 2.0000 | ORAL_TABLET | ORAL | Status: DC | PRN

## 2018-06-05 MED ORDER — OXYTOCIN 10 UNIT/ML IJ SOLN
INTRAMUSCULAR | Status: AC
  Administered 2018-06-05: 10 [IU]
  Filled 2018-06-05: qty 2

## 2018-06-05 MED ORDER — LACTATED RINGERS IV SOLN: INTRAVENOUS | Status: DC

## 2018-06-05 MED ORDER — BENZOCAINE-MENTHOL 20-0.5 % EX AERO
1.0000 "application " | INHALATION_SPRAY | CUTANEOUS | Status: DC | PRN
  Filled 2018-06-05: qty 56

## 2018-06-05 MED ORDER — SODIUM CHLORIDE 0.9 % IV SOLN: 250.0000 mL | INTRAVENOUS | Status: DC | PRN

## 2018-06-05 MED ORDER — SENNOSIDES-DOCUSATE SODIUM 8.6-50 MG PO TABS
2.0000 | ORAL_TABLET | ORAL | Status: DC
  Administered 2018-06-05 – 2018-06-06 (×2): 2 via ORAL
  Filled 2018-06-05 (×2): qty 2

## 2018-06-05 MED ORDER — ONDANSETRON HCL 4 MG/2ML IJ SOLN: 4.0000 mg | INTRAMUSCULAR | Status: DC | PRN

## 2018-06-05 MED ORDER — SODIUM CHLORIDE 0.9% FLUSH: 3.0000 mL | Freq: Two times a day (BID) | INTRAVENOUS | Status: DC

## 2018-06-05 MED ORDER — OXYTOCIN 10 UNIT/ML IJ SOLN: 10.0000 [IU] | Freq: Once | INTRAMUSCULAR | Status: DC

## 2018-06-05 MED ORDER — OXYTOCIN BOLUS FROM INFUSION: 500.0000 mL | Freq: Once | INTRAVENOUS | Status: DC

## 2018-06-05 MED ORDER — LIDOCAINE HCL (PF) 1 % IJ SOLN
30.0000 mL | INTRAMUSCULAR | Status: DC | PRN
  Filled 2018-06-05: qty 30

## 2018-06-05 MED ORDER — HYDROXYZINE HCL 50 MG PO TABS
50.0000 mg | ORAL_TABLET | Freq: Four times a day (QID) | ORAL | Status: DC | PRN
  Filled 2018-06-05: qty 1

## 2018-06-05 MED ORDER — TETANUS-DIPHTH-ACELL PERTUSSIS 5-2.5-18.5 LF-MCG/0.5 IM SUSP
0.5000 mL | Freq: Once | INTRAMUSCULAR | Status: DC
  Filled 2018-06-05: qty 0.5

## 2018-06-05 MED ORDER — FENTANYL CITRATE (PF) 100 MCG/2ML IJ SOLN: 50.0000 ug | INTRAMUSCULAR | Status: DC | PRN

## 2018-06-05 MED ORDER — MEASLES, MUMPS & RUBELLA VAC ~~LOC~~ INJ
0.5000 mL | INJECTION | Freq: Once | SUBCUTANEOUS | Status: DC
  Filled 2018-06-05: qty 0.5

## 2018-06-05 MED ORDER — SIMETHICONE 80 MG PO CHEW: 80.0000 mg | CHEWABLE_TABLET | ORAL | Status: DC | PRN

## 2018-06-05 MED ORDER — SOD CITRATE-CITRIC ACID 500-334 MG/5ML PO SOLN: 30.0000 mL | ORAL | Status: DC | PRN

## 2018-06-05 MED ORDER — LACTATED RINGERS IV SOLN: 500.0000 mL | INTRAVENOUS | Status: DC | PRN

## 2018-06-05 MED ORDER — ZOLPIDEM TARTRATE 5 MG PO TABS: 5.0000 mg | ORAL_TABLET | Freq: Every evening | ORAL | Status: DC | PRN

## 2018-06-05 MED ORDER — DIPHENHYDRAMINE HCL 25 MG PO CAPS: 25.0000 mg | ORAL_CAPSULE | Freq: Four times a day (QID) | ORAL | Status: DC | PRN

## 2018-06-05 MED ORDER — ACETAMINOPHEN 325 MG PO TABS
650.0000 mg | ORAL_TABLET | ORAL | Status: DC | PRN
  Filled 2018-06-05: qty 2

## 2018-06-05 MED ORDER — PRENATAL MULTIVITAMIN CH
1.0000 | ORAL_TABLET | Freq: Every day | ORAL | Status: DC
  Administered 2018-06-06 – 2018-06-07 (×2): 1 via ORAL
  Filled 2018-06-05 (×2): qty 1

## 2018-06-05 MED ORDER — ONDANSETRON HCL 4 MG PO TABS: 4.0000 mg | ORAL_TABLET | ORAL | Status: DC | PRN

## 2018-06-05 MED ORDER — DIBUCAINE 1 % RE OINT
1.0000 "application " | TOPICAL_OINTMENT | RECTAL | Status: DC | PRN
  Filled 2018-06-05: qty 28

## 2018-06-05 NOTE — H&P (Signed)
Christine Lambert is a 22 y.o. femaleG2P1001 @39 .4 wks  presenting for SOL. GBS neg OB History    Gravida  2   Para  1   Term  1   Preterm  0   AB  0   Living  1     SAB  0   TAB  0   Ectopic  0   Multiple  0   Live Births  1          Past Medical History:  Diagnosis Date  . 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 Headache    migraines   Past Surgical History:  Procedure Laterality Date  . NO PAST SURGERIES     Family History: family history is not on file. Social History:  reports that she quit smoking about 2 years ago. She has never used smokeless tobacco. She reports that she does not drink alcohol or use drugs.     Maternal Diabetes: No Genetic Screening: Normal Maternal Ultrasounds/Referrals: Normal Fetal Ultrasounds or other Referrals:  None Maternal Substance Abuse:  No Significant Maternal Medications:  None Significant Maternal Lab Results:  None Other Comments:  None  ROS Maternal Medical History:  Reason for admission: Contractions.   Contractions: Onset was 3-5 hours ago.   Frequency: regular.   Perceived severity is moderate.    Fetal activity: Perceived fetal activity is normal.   Last perceived fetal movement was within the past hour.    Prenatal complications: no prenatal complications Prenatal Complications - Diabetes: none.      Blood pressure (!) 95/57, pulse 67, temperature (!) 97.2 F (36.2 C), temperature source Oral, resp. rate 18, weight 67.6 kg, last menstrual period 08/20/2017, unknown if currently breastfeeding. Maternal Exam:  Uterine Assessment: Contraction strength is moderate.  Contraction frequency is regular.   Abdomen: Patient reports no abdominal tenderness. Fetal presentation: vertex  Introitus: Normal vulva. Normal vagina.  Amniotic fluid character: not assessed.  Pelvis: adequate for delivery.   Cervix: Cervix evaluated by digital exam.     Fetal Exam Fetal Monitor  Review: Mode: ultrasound.   Variability: moderate (6-25 bpm).   Pattern: accelerations present and no decelerations.    Fetal State Assessment: Category I - tracings are normal.     Physical Exam  Constitutional: She is oriented to person, place, and time. She appears well-developed and well-nourished.  HENT:  Head: Normocephalic and atraumatic.  Eyes: Pupils are equal, round, and reactive to light.  Neck: Normal range of motion.  Cardiovascular: Normal rate, regular rhythm, normal heart sounds and intact distal pulses.  Respiratory: Breath sounds normal.  GI: Soft. Bowel sounds are normal.  Genitourinary: Vagina normal and uterus normal.  Musculoskeletal: Normal range of motion.  Neurological: She is alert and oriented to person, place, and time. She has normal reflexes.  Skin: Skin is warm and dry.  Psychiatric: She has a normal mood and affect. Her behavior is normal. Judgment and thought content normal.    Prenatal labs: ABO, Rh: B/Positive/-- (05/28 1156) Antibody: Negative (05/28 1156) Rubella: 1.22 (05/28 1156) RPR: Non Reactive (07/09 1732)  HBsAg: Negative (05/28 1156)  HIV: Non Reactive (07/09 1732)  GBS:     Assessment/Plan: preg 39.4 wks SOL GBS nef\g  Active labor admit   Wyvonnia Dusky 06/05/2018, 3:17 PM

## 2018-06-05 NOTE — MAU Note (Signed)
Reports strong UC for the last 1.5 hours, no LOF, +FM, some bloody mucous

## 2018-06-06 LAB — RPR: RPR: NONREACTIVE

## 2018-06-06 NOTE — Progress Notes (Signed)
Post Partum Day 1 Subjective: up ad lib, voiding, tolerating PO and some cramping abdominal pain not well managed by ibuprofen but improved with oxycodone  Objective: Blood pressure 104/68, pulse (!) 56, temperature (!) 97.5 F (36.4 C), temperature source Axillary, resp. rate 18, height 5\' 1"  (1.549 m), weight 67.1 kg, last menstrual period 08/20/2017, SpO2 100 %, unknown if currently breastfeeding.  Physical Exam:  General: alert, cooperative and no distress Lochia: appropriate Uterine Fundus: firm Incision: n/a DVT Evaluation: No evidence of DVT seen on physical exam.  Recent Labs    06/05/18 1546  HGB 11.7*  HCT 35.1*    Assessment/Plan: Plan for discharge tomorrow and Contraception Nexplanon   LOS: 1 day   Sharen Counter 06/06/2018, 9:14 AM

## 2018-06-07 NOTE — Discharge Summary (Addendum)
OB Discharge Summary     Patient Name: Christine Lambert DOB: [birth year] MRN: [id number]  Date of admission: 06/05/2018 Delivering MD: Wyvonnia Dusky D   Date of discharge: 06/07/2018  Admitting diagnosis: 39wks ctx Intrauterine pregnancy: [redacted]w[redacted]d     Secondary diagnosis:  Active Problems:   Abdominal pain in pregnancy, third trimester  Additional problems: none     Discharge diagnosis: Term Pregnancy Delivered                                                                                                Post partum procedures:none  Augmentation: none  Complications: None  Hospital course:  Onset of Labor With Vaginal Delivery     22 y.o. yo [id number] at [redacted]w[redacted]d was admitted in Latent Labor on 06/05/2018. Patient had an uncomplicated labor course as follows:  Membrane Rupture Time/Date: 3:31 PM ,06/05/2018   Intrapartum Procedures: Episiotomy: None [1]                                         Lacerations:  None [1]  Patient had a delivery of a Viable infant. 06/05/2018  Information for the patient's newborn:  Averill, Gorra [[id number]]  Delivery Method: Vaginal, Spontaneous(Filed from Delivery Summary)    Pateint had an uncomplicated postpartum course.  She is ambulating, tolerating a regular diet, passing flatus, and urinating well. Patient is discharged home in stable condition on 06/07/18.   Physical exam  Vitals:   06/06/18 0539 06/06/18 1522 06/06/18 2231 06/07/18 0557  BP: 104/68 95/62 100/64 101/65  Pulse: (!) 56 (!) 59 67 61  Resp: 18 16 18 18   Temp: (!) 97.5 F (36.4 C) 97.8 F (36.6 C) 97.9 F (36.6 C) 98.5 F (36.9 C)  TempSrc: Axillary  Oral   SpO2: 100% 98%  99%  Weight:      Height:       General: alert, cooperative and no distress Lochia: appropriate Uterine Fundus: firm Incision: N/A DVT Evaluation: No evidence of DVT seen on physical exam. Labs: Lab Results  Component Value Date   WBC 14.5 (H) 06/05/2018   HGB 11.7 (L) 06/05/2018   HCT  35.1 (L) 06/05/2018   MCV 88.6 06/05/2018   PLT 302 06/05/2018   CMP Latest Ref Rng & Units 05/19/2016  Glucose 65 - 99 mg/dL 67  BUN 6 - 20 mg/dL 7  Creatinine 1.82 - 9.93 mg/dL 7.16(R)  Sodium 678 - 938 mmol/L 141  Potassium 3.5 - 5.2 mmol/L 4.0  Chloride 96 - 106 mmol/L 103  CO2 18 - 29 mmol/L 22  Calcium 8.7 - 10.2 mg/dL 8.9  Total Protein 6.0 - 8.5 g/dL 6.0  Total Bilirubin 0.0 - 1.2 mg/dL <1.0  Alkaline Phos 39 - 117 IU/L 125(H)  AST 0 - 40 IU/L 14  ALT 0 - 32 IU/L 18    Discharge instruction: per After Visit Summary and "Baby and Me Booklet".  After visit meds:  Allergies as of 06/07/2018      Reactions  Nickel Rash      Medication List    TAKE these medications   diphenhydrAMINE 25 MG tablet Commonly known as:  BENADRYL Take 25 mg by mouth at bedtime as needed for allergies.   multivitamin-prenatal 27-0.8 MG Tabs tablet Take 1 tablet by mouth daily at 12 noon.   polyethylene glycol powder powder Commonly known as:  GLYCOLAX/MIRALAX Take 17 g by mouth daily.       Diet: routine diet  Activity: Advance as tolerated. Pelvic rest for 6 weeks.   Outpatient follow up:4 weeks Follow up Appt:No future appointments. Follow up Visit:No follow-ups on file.  Postpartum contraception: Nexplanon  Newborn Data: Live born female  Birth Weight: 7 lb 2.6 oz (3249 g) APGAR: 8, 9  Newborn Delivery   Birth date/time:  06/05/2018 15:37:00 Delivery type:  Vaginal, Spontaneous     Baby Feeding: Bottle and Breast Disposition:home with mother   06/07/2018 Mirian Mo, MD   OB FELLOW DISCHARGE ATTESTATION  I have seen and examined this patient and agree with above documentation in the resident's note.   Marcy Siren, D.O. OB Fellow  06/07/2018, 11:38 AM

## 2018-06-07 NOTE — Discharge Instructions (Signed)
Vaginal Delivery, Care After °Refer to this sheet in the next few weeks. These instructions provide you with information about caring for yourself after vaginal delivery. Your health care provider may also give you more specific instructions. Your treatment has been planned according to current medical practices, but problems sometimes occur. Call your health care provider if you have any problems or questions. °What can I expect after the procedure? °After vaginal delivery, it is common to have: °· Some bleeding from your vagina. °· Soreness in your abdomen, your vagina, and the area of skin between your vaginal opening and your anus (perineum). °· Pelvic cramps. °· Fatigue. ° °Follow these instructions at home: °Medicines °· Take over-the-counter and prescription medicines only as told by your health care provider. °· If you were prescribed an antibiotic medicine, take it as told by your health care provider. Do not stop taking the antibiotic until it is finished. °Driving ° °· Do not drive or operate heavy machinery while taking prescription pain medicine. °· Do not drive for 24 hours if you received a sedative. °Lifestyle °· Do not drink alcohol. This is especially important if you are breastfeeding or taking medicine to relieve pain. °· Do not use tobacco products, including cigarettes, chewing tobacco, or e-cigarettes. If you need help quitting, ask your health care provider. °Eating and drinking °· Drink at least 8 eight-ounce glasses of water every day unless you are told not to by your health care provider. If you choose to breastfeed your baby, you may need to drink more water than this. °· Eat high-fiber foods every day. These foods may help prevent or relieve constipation. High-fiber foods include: °? Whole grain cereals and breads. °? Brown rice. °? Beans. °? Fresh fruits and vegetables. °Activity °· Return to your normal activities as told by your health care provider. Ask your health care provider  what activities are safe for you. °· Rest as much as possible. Try to rest or take a nap when your baby is sleeping. °· Do not lift anything that is heavier than your baby or 10 lb (4.5 kg) until your health care provider says that it is safe. °· Talk with your health care provider about when you can engage in sexual activity. This may depend on your: °? Risk of infection. °? Rate of healing. °? Comfort and desire to engage in sexual activity. °Vaginal Care °· If you have an episiotomy or a vaginal tear, check the area every day for signs of infection. Check for: °? More redness, swelling, or pain. °? More fluid or blood. °? Warmth. °? Pus or a bad smell. °· Do not use tampons or douches until your health care provider says this is safe. °· Watch for any blood clots that may pass from your vagina. These may look like clumps of dark red, brown, or black discharge. °General instructions °· Keep your perineum clean and dry as told by your health care provider. °· Wear loose, comfortable clothing. °· Wipe from front to back when you use the toilet. °· Ask your health care provider if you can shower or take a bath. If you had an episiotomy or a perineal tear during labor and delivery, your health care provider may tell you not to take baths for a certain length of time. °· Wear a bra that supports your breasts and fits you well. °· If possible, have someone help you with household activities and help care for your baby for at least a few days after   you leave the hospital. °· Keep all follow-up visits for you and your baby as told by your health care provider. This is important. °Contact a health care provider if: °· You have: °? Vaginal discharge that has a bad smell. °? Difficulty urinating. °? Pain when urinating. °? A sudden increase or decrease in the frequency of your bowel movements. °? More redness, swelling, or pain around your episiotomy or vaginal tear. °? More fluid or blood coming from your episiotomy or  vaginal tear. °? Pus or a bad smell coming from your episiotomy or vaginal tear. °? A fever. °? A rash. °? Little or no interest in activities you used to enjoy. °? Questions about caring for yourself or your baby. °· Your episiotomy or vaginal tear feels warm to the touch. °· Your episiotomy or vaginal tear is separating or does not appear to be healing. °· Your breasts are painful, hard, or turn red. °· You feel unusually sad or worried. °· You feel nauseous or you vomit. °· You pass large blood clots from your vagina. If you pass a blood clot from your vagina, save it to show to your health care provider. Do not flush blood clots down the toilet without having your health care provider look at them. °· You urinate more than usual. °· You are dizzy or light-headed. °· You have not breastfed at all and you have not had a menstrual period for 12 weeks after delivery. °· You have stopped breastfeeding and you have not had a menstrual period for 12 weeks after you stopped breastfeeding. °Get help right away if: °· You have: °? Pain that does not go away or does not get better with medicine. °? Chest pain. °? Difficulty breathing. °? Blurred vision or spots in your vision. °? Thoughts about hurting yourself or your baby. °· You develop pain in your abdomen or in one of your legs. °· You develop a severe headache. °· You faint. °· You bleed from your vagina so much that you fill two sanitary pads in one hour. °This information is not intended to replace advice given to you by your health care provider. Make sure you discuss any questions you have with your health care provider. °Document Released: 09/18/2000 Document Revised: 03/04/2016 Document Reviewed: 10/06/2015 °Elsevier Interactive Patient Education © 2018 [employer] ° °

## 2018-06-07 NOTE — Lactation Note (Signed)
This note was copied from a baby's chart. Lactation Consultation Note  Patient Name: Girl Idil Gillock VXYIA'X Date: 06/07/2018 Reason for consult: Initial assessment;Term P2, 39 hour female infant. Per mom, she BF her eldest daughter for 6 months. Mom changed her feeding plan, she now wants to BF for 6 weeks before returning to work and school. Mom had given infant formula prior to [facility] entering room infant in basinet sleeping. LC ask mom to hand express because she was getting discouraged due not seeing milk  present in breast pump. Mom hand expressed 6 ml of breast milk. Mom now plan to breastfeed first, then supplement w/ formula if infant is still cuing.  LC discussed I&O. Mom is thinking about going BF support  group at [facility] Reviewed Baby & Me book's Breastfeeding Basics.  Mom encouraged to feed baby 8-12 times/24 hours and with feeding cues.  Mom made aware of O/P services, breastfeeding support groups, community resources, and our phone # for post-discharge questions.  Maternal Data    Feeding Feeding Type: Bottle Fed - Formula Nipple Type: Slow - flow Length of feed: 5 min  LATCH Score                   Interventions Interventions: Breast feeding basics reviewed;Hand express;Hand pump  Lactation Tools Discussed/Used     Consult Status Consult Status: Complete    Danelle Earthly 06/07/2018, 6:54 AM

## 2018-07-05 ENCOUNTER — Other Ambulatory Visit: Payer: Self-pay

## 2018-07-05 ENCOUNTER — Encounter (HOSPITAL_COMMUNITY): Payer: Self-pay | Admitting: Emergency Medicine

## 2018-07-05 ENCOUNTER — Emergency Department (HOSPITAL_COMMUNITY)
Admission: EM | Admit: 2018-07-05 | Discharge: 2018-07-05 | Disposition: A | Discharge status: Home / Self Care | Payer: Medicaid Other | Attending: Emergency Medicine | Admitting: Emergency Medicine

## 2018-07-05 DIAGNOSIS — Z87891 Personal history of nicotine dependence: Secondary | ICD-10-CM | POA: Insufficient documentation

## 2018-07-05 DIAGNOSIS — Z79899 Other long term (current) drug therapy: Secondary | ICD-10-CM | POA: Insufficient documentation

## 2018-07-05 DIAGNOSIS — R232 Flushing: Secondary | ICD-10-CM | POA: Diagnosis not present

## 2018-07-05 DIAGNOSIS — R6883 Chills (without fever): Secondary | ICD-10-CM | POA: Diagnosis not present

## 2018-07-05 DIAGNOSIS — R05 Cough: Secondary | ICD-10-CM | POA: Diagnosis present

## 2018-07-05 DIAGNOSIS — R070 Pain in throat: Secondary | ICD-10-CM | POA: Insufficient documentation

## 2018-07-05 DIAGNOSIS — J069 Acute upper respiratory infection, unspecified: Secondary | ICD-10-CM | POA: Diagnosis not present

## 2018-07-05 LAB — URINALYSIS, ROUTINE W REFLEX MICROSCOPIC
Bilirubin Urine: NEGATIVE
GLUCOSE, UA: NEGATIVE mg/dL
KETONES UR: NEGATIVE mg/dL
Nitrite: POSITIVE — AB
PH: 6 (ref 5.0–8.0)
PROTEIN: NEGATIVE mg/dL
Specific Gravity, Urine: 1.018 (ref 1.005–1.030)

## 2018-07-05 LAB — CBC
HCT: 41.7 % (ref 36.0–46.0)
Hemoglobin: 13 g/dL (ref 12.0–15.0)
MCH: 28.3 pg (ref 26.0–34.0)
MCHC: 31.2 g/dL (ref 30.0–36.0)
MCV: 90.8 fL (ref 78.0–100.0)
PLATELETS: 319 10*3/uL (ref 150–400)
RBC: 4.59 MIL/uL (ref 3.87–5.11)
RDW: 13.8 % (ref 11.5–15.5)
WBC: 10.2 10*3/uL (ref 4.0–10.5)

## 2018-07-05 LAB — COMPREHENSIVE METABOLIC PANEL
ALBUMIN: 3.9 g/dL (ref 3.5–5.0)
ALK PHOS: 64 U/L (ref 38–126)
ALT: 16 U/L (ref 0–44)
AST: 22 U/L (ref 15–41)
Anion gap: 13 (ref 5–15)
BUN: 9 mg/dL (ref 6–20)
CALCIUM: 9.4 mg/dL (ref 8.9–10.3)
CHLORIDE: 100 mmol/L (ref 98–111)
CO2: 25 mmol/L (ref 22–32)
CREATININE: 0.86 mg/dL (ref 0.44–1.00)
GFR calc non Af Amer: >60 mL/min (ref >60)
GLUCOSE: 81 mg/dL (ref 70–99)
Potassium: 3.6 mmol/L (ref 3.5–5.1)
SODIUM: 138 mmol/L (ref 135–145)
Total Bilirubin: 0.4 mg/dL (ref 0.3–1.2)
Total Protein: 6.8 g/dL (ref 6.5–8.1)

## 2018-07-05 LAB — I-STAT BETA HCG BLOOD, ED (MC, WL, AP ONLY): I-stat hCG, quantitative: <5 m[IU]/mL (ref <5)

## 2018-07-05 LAB — LIPASE, BLOOD: LIPASE: 28 U/L (ref 11–51)

## 2018-07-05 LAB — GROUP A STREP BY PCR: Group A Strep by PCR: NOT DETECTED

## 2018-07-05 MED ORDER — DEXAMETHASONE 6 MG PO TABS: 6.0000 mg | ORAL_TABLET | Freq: Once | ORAL | 0 refills | Status: AC

## 2018-07-05 MED ORDER — ALBUTEROL SULFATE HFA 108 (90 BASE) MCG/ACT IN AERS: 2.0000 | INHALATION_SPRAY | RESPIRATORY_TRACT | 0 refills | Status: DC | PRN

## 2018-07-05 MED ORDER — ACETAMINOPHEN 325 MG PO TABS
650.0000 mg | ORAL_TABLET | Freq: Once | ORAL | Status: AC | PRN
  Administered 2018-07-05: 650 mg via ORAL
  Filled 2018-07-05: qty 2

## 2018-07-05 MED ORDER — BENZONATATE 100 MG PO CAPS: 100.0000 mg | ORAL_CAPSULE | Freq: Three times a day (TID) | ORAL | 0 refills | Status: DC

## 2018-07-05 NOTE — ED Provider Notes (Signed)
[facility] EMERGENCY DEPARTMENT Provider Note   CSN: [id number] Arrival date & time: 07/05/18  1957     History   Chief Complaint Chief Complaint  Patient presents with  . Fever  . Sore Throat    HPI Christine Lambert is a 22 y.o. female.  Patient presents to the emergency department for evaluation of URI symptoms.  Patient reports that she started having a cough yesterday.  Today she began to have chills, flushing, sore throat.  She is not short of breath.  Patient reports that she had a spontaneous vaginal delivery 1 month ago.  She is no longer breast-feeding.  No rash, breast problems.  She is not experiencing any abdominal pain.     Past Medical History:  Diagnosis Date  . 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 Headache    migraines    Patient Active Problem List   Diagnosis Date Noted  . Abdominal pain in pregnancy, third trimester 06/05/2018  . Limited prenatal care in second trimester 03/29/2018  . Asymptomatic bacteriuria during pregnancy 03/07/2018  . Supervision of other normal pregnancy, antepartum 03/01/2018  . Arthritis 03/01/2018  . History of migraine headaches 03/01/2018  . Traumatic injury during pregnancy 01/27/2018    Past Surgical History:  Procedure Laterality Date  . NO PAST SURGERIES       OB History    Gravida  2   Para  2   Term  2   Preterm  0   AB  0   Living  2     SAB  0   TAB  0   Ectopic  0   Multiple  0   Live Births  2            Home Medications    Prior to Admission medications   Medication Sig Start Date End Date Taking? Authorizing Provider  albuterol (PROVENTIL HFA;VENTOLIN HFA) 108 (90 Base) MCG/ACT inhaler Inhale 2 puffs into the lungs every 4 (four) hours as needed for wheezing or shortness of breath. 07/05/18   Gilda Crease, MD  benzonatate (TESSALON) 100 MG capsule Take 1 capsule (100 mg total) by mouth every 8 (eight) hours. 07/05/18    Gilda Crease, MD  diphenhydrAMINE (BENADRYL) 25 MG tablet Take 25 mg by mouth at bedtime as needed for allergies.    [provider]  polyethylene glycol powder (MIRALAX) powder Take 17 g by mouth daily. 11/07/17   Degele, Kandra Nicolas, MD  Prenatal Vit-Fe Fumarate-FA (MULTIVITAMIN-PRENATAL) 27-0.8 MG TABS tablet Take 1 tablet by mouth daily at 12 noon.    [provider]    Family History No family history on file.  Social History Social History   Tobacco Use  . Smoking status: Former Smoker    Last attempt to quit: 08/18/2015    Years since quitting: 2.8  . Smokeless tobacco: Never Used  Substance Use Topics  . Alcohol use: No  . Drug use: No     Allergies   Nickel   Review of Systems Review of Systems  Constitutional: Positive for chills and fever.  HENT: Positive for congestion and sore throat.   Respiratory: Positive for cough.   All other systems reviewed and are negative.    Physical Exam Updated Vital Signs BP 90/69 (BP Location: Right Arm)   Pulse 97   Temp (!) 100.7 F (38.2 C) (Oral)   Resp 18   Ht 5\' 1"  (1.549 m)  LMP 08/20/2017   SpO2 97%   BMI 27.97 kg/m   Physical Exam  Constitutional: She is oriented to person, place, and time. She appears well-developed and well-nourished. No distress.  HENT:  Head: Normocephalic and atraumatic.  Right Ear: Hearing normal.  Left Ear: Hearing normal.  Nose: Nose normal.  Mouth/Throat: Oropharynx is clear and moist and mucous membranes are normal.  Eyes: Pupils are equal, round, and reactive to light. Conjunctivae and EOM are normal.  Neck: Normal range of motion. Neck supple.  Cardiovascular: Regular rhythm, S1 normal and S2 normal. Exam reveals no gallop and no friction rub.  No murmur heard. Pulmonary/Chest: Effort normal and breath sounds normal. No respiratory distress. She exhibits no tenderness.  Abdominal: Soft. Normal appearance and bowel sounds are normal. There is no  hepatosplenomegaly. There is no tenderness. There is no rebound, no guarding, no tenderness at McBurney's point and negative Murphy's sign. No hernia.  Musculoskeletal: Normal range of motion.  Neurological: She is alert and oriented to person, place, and time. She has normal strength. No cranial nerve deficit or sensory deficit. Coordination normal. GCS eye subscore is 4. GCS verbal subscore is 5. GCS motor subscore is 6.  Skin: Skin is warm, dry and intact. No rash noted. No cyanosis.  Psychiatric: She has a normal mood and affect. Her speech is normal and behavior is normal. Thought content normal.  Nursing note and vitals reviewed.    ED Treatments / Results  Labs (all labs ordered are listed, but only abnormal results are displayed) Labs Reviewed  URINALYSIS, ROUTINE W REFLEX MICROSCOPIC - Abnormal; Notable for the following components:      Result Value   APPearance HAZY (*)    Hgb urine dipstick SMALL (*)    Nitrite POSITIVE (*)    Leukocytes, UA LARGE (*)    Bacteria, UA RARE (*)    All other components within normal limits  GROUP A STREP BY PCR  URINE CULTURE  LIPASE, BLOOD  COMPREHENSIVE METABOLIC PANEL  CBC  I-STAT BETA HCG BLOOD, ED ([facility], [facility], [facility] ONLY)    EKG None  Radiology No results found.  Procedures Procedures (including critical care time)  Medications Ordered in ED Medications  acetaminophen (TYLENOL) tablet 650 mg (650 mg Oral Given 07/05/18 2132)     Initial Impression / Assessment and Plan / ED Course  I have reviewed the triage vital signs and the nursing notes.  Pertinent labs & imaging results that were available during my care of the patient were reviewed by me and considered in my medical decision making (see chart for details).     Patient presents to the emergency department for evaluation of fever, sore throat, cough and congestion.  Symptoms began yesterday, worsened today.  She did have a fever of 102.7 at arrival.  She is not in any  distress.  Abdominal exam is benign.  No sign of peritonsillar abscess.  Strep negative.  All lab work unremarkable.  Likely viral in nature.  She does have a contaminated looking urine, we will culture.  She is not experiencing any urinary symptoms.  Final Clinical Impressions(s) / ED Diagnoses   Final diagnoses:  Upper respiratory tract infection, unspecified type    ED Discharge Orders         Ordered    benzonatate (TESSALON) 100 MG capsule  Every 8 hours     07/05/18 2328    albuterol (PROVENTIL HFA;VENTOLIN HFA) 108 (90 Base) MCG/ACT inhaler  Every 4 hours PRN  07/05/18 2335    dexamethasone (DECADRON) 6 MG tablet   Once     07/05/18 2335           Gilda Crease, MD 07/07/18 [phone]

## 2018-07-05 NOTE — ED Provider Notes (Signed)
Patient placed in Quick Look pathway, seen and evaluated   Chief Complaint: fever sore throat  HPI: Christine Lambert is a 22 y.o. [id number] s/p SVD one month ago who presents to the ED with fever, chills, sore throat and abdominal pain. Patient is not breast feeding.   ROS: General: fever, chills  ENT: sore throat  Physical Exam:  BP 113/76   Pulse (!) 111   Temp (!) 102.7 F (39.3 C)   Resp 19   Ht 5\' 1"  (1.549 m)   LMP 08/20/2017   SpO2 97%   BMI 27.97 kg/m     Gen: No distress, febrile  Neuro: Awake and Alert  Skin: Warm and dry  ENT: throat with erythema, uvula midline, no tonsillar abscess  Heart: tachycardia  Initiation of care has begun. The patient has been counseled on the process, plan, and necessity for staying for the completion/evaluation, and the remainder of the medical screening examination    Janne Napoleon, NP 07/05/18 2027    Shaune Pollack, MD 07/05/18 2354

## 2018-07-05 NOTE — ED Triage Notes (Signed)
Pt reports fever, chills, sore throat, back pain and body aches.  Pt also reports abd pain.   Pt had a baby 1 month ago, just stopped breast feeding.

## 2018-10-05 NOTE — L&D Delivery Note (Signed)
Labor and Delivery Note  Christine Lambert is a 23 y.o. G3P2002 s/p NSVD at [redacted]w[redacted]d. She was admitted for spontaneous labor.   ROM: Delivered en caul fluid clear  GBS Status: Negative/-- (09/30 1529)   Labor Progress: . Patient presented to L&D for SOL. Initial SVE: 4.5 cm. She then progressed to complete.   Delivery Date/Time: 07/31/2019 @1137   Delivery: Called to room and patient was complete and pushing. Head delivered ROA No nuchal cord present present. Shoulder and body delivered in usual fashion. Infant with spontaneous cry, placed on mother's abdomen, dried and stimulated. Cord clamped x 2 after 1-minute delay, and cut by father. Cord blood drawn. Placenta delivered spontaneously with gentle cord traction. Fundus firm with massage and Pitocin. Labia, perineum, vagina, and cervix inspected inspected with no lacerations.   Baby Weight: 3260 grams  Placenta: Sent to L&D Complications: None Lacerations: none EBL: 21 cc Analgesia: Epidural   Infant: APGAR (1 MIN): 8   APGAR (5 MINS): 9    Maxie Better, MD, PGY1  [facility] [place] [facility] 07/31/2019, 11:56 AM    OB FELLOW ATTESTATION  I was present, gloved, and supervising throughout delivery and agree with above documentation in the resident's note except as noted below.  Augustin Coupe, MD/MPH OB Fellow  07/31/2019, 1:07 PM

## 2018-12-08 IMAGING — US US OB COMP LESS 14 WK
1 series · 15 of 28 positions shown · non-contrast
Comparison: None.

CLINICAL DATA: Pregnant patient with lower abdominal cramping.

EXAM:
OBSTETRIC <14 WK US AND TRANSVAGINAL OB US
TECHNIQUE: Both transabdominal and transvaginal ultrasound examinations were
performed for complete evaluation of the gestation as well as the
maternal uterus, adnexal regions, and pelvic cul-de-sac.
Transvaginal technique was performed to assess early pregnancy.

[Series 1: us ob comp less 14 wk · 15 of 54 slices shown]
[im 1/54]
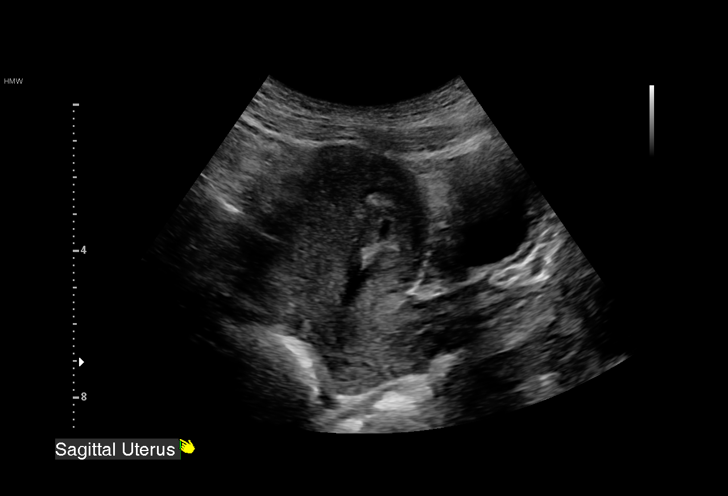
[im 4/54]
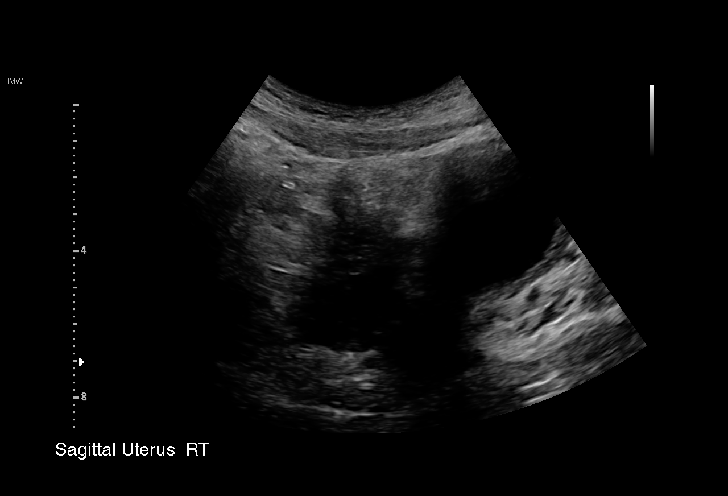
[im 8/54]
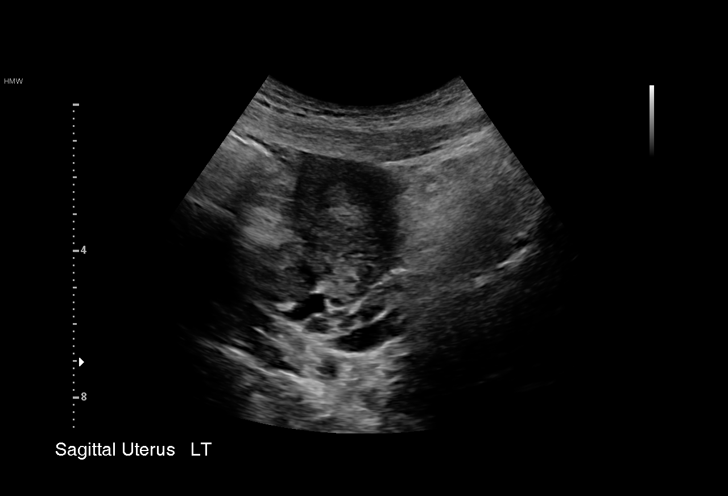
[im 12/54]
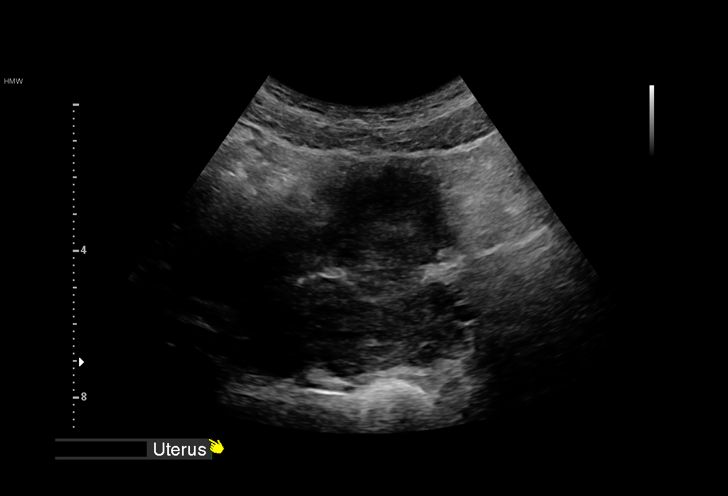
[im 16/54]
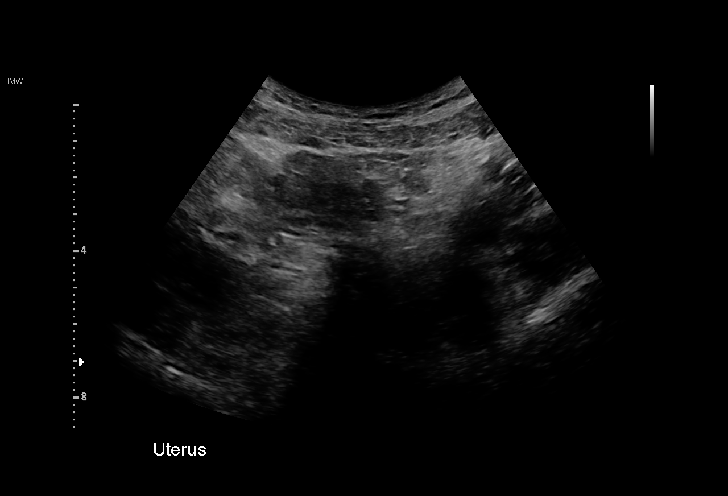
[im 20/54]
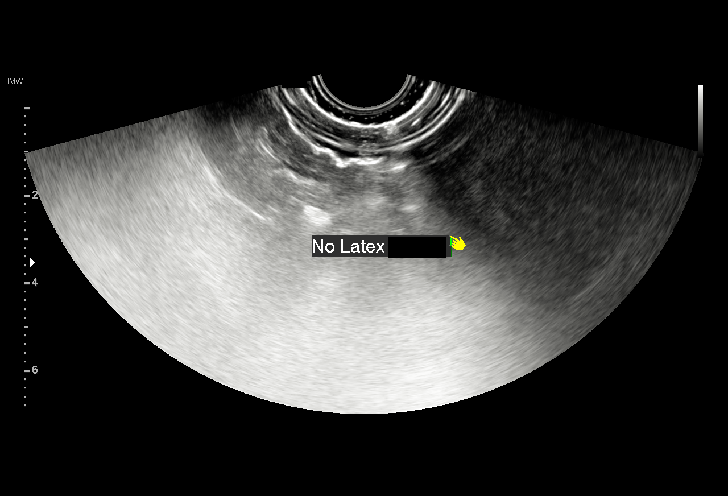
[im 24/54]
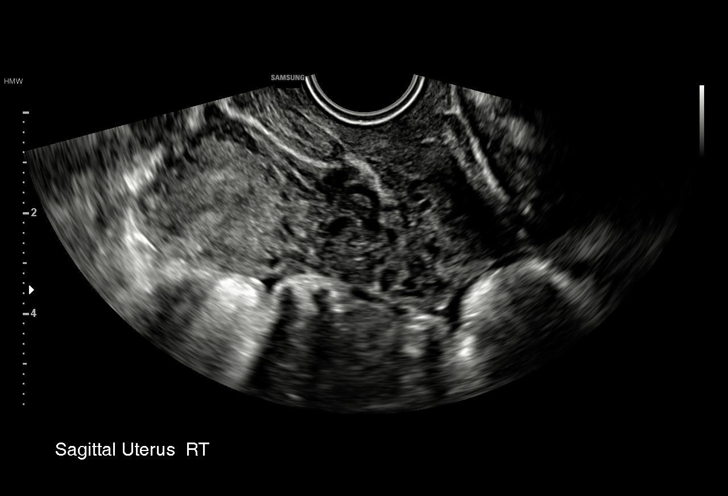
[im 28/54]
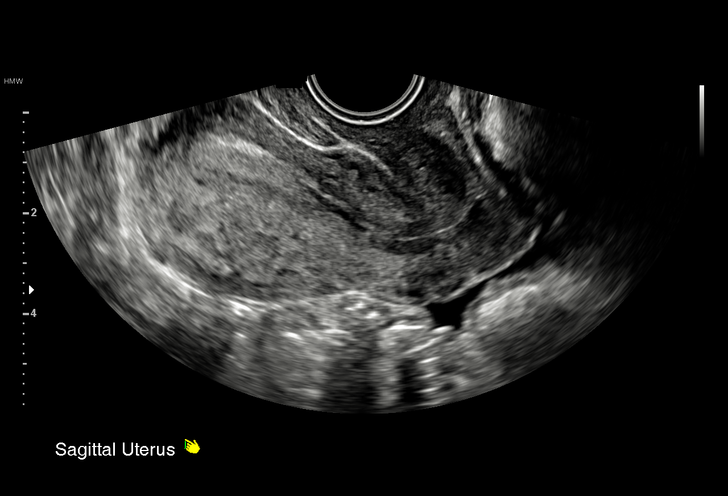
[im 30/54]
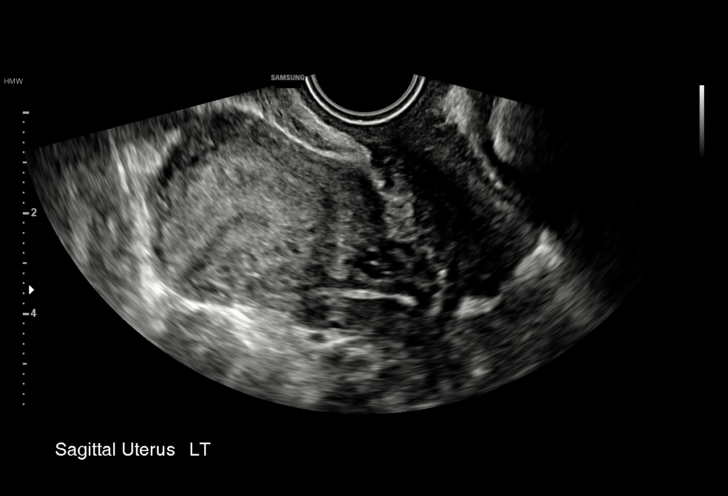
[im 34/54]
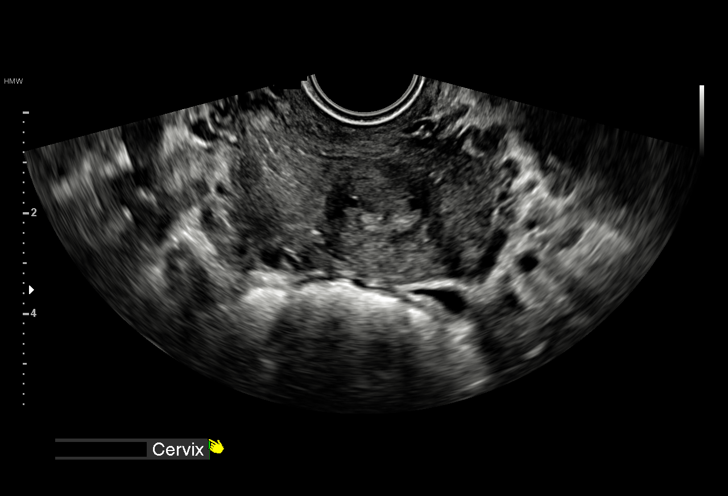
[im 38/54]
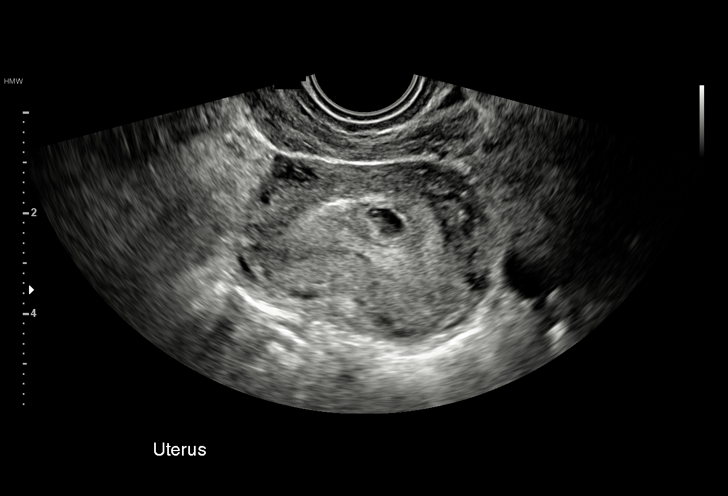
[im 42/54]
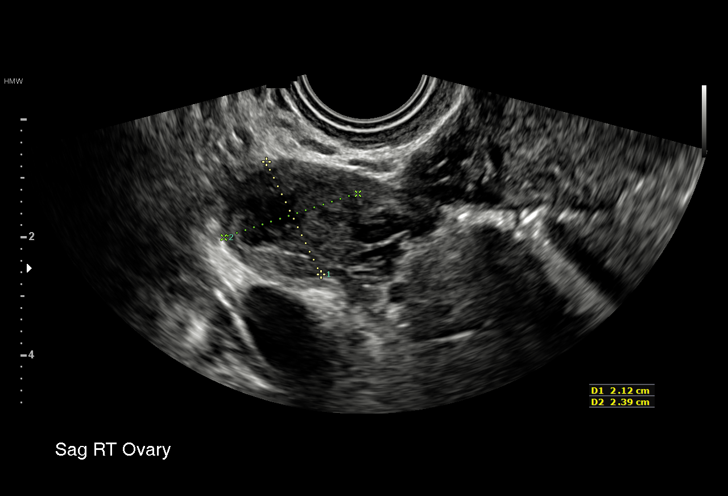
[im 46/54]
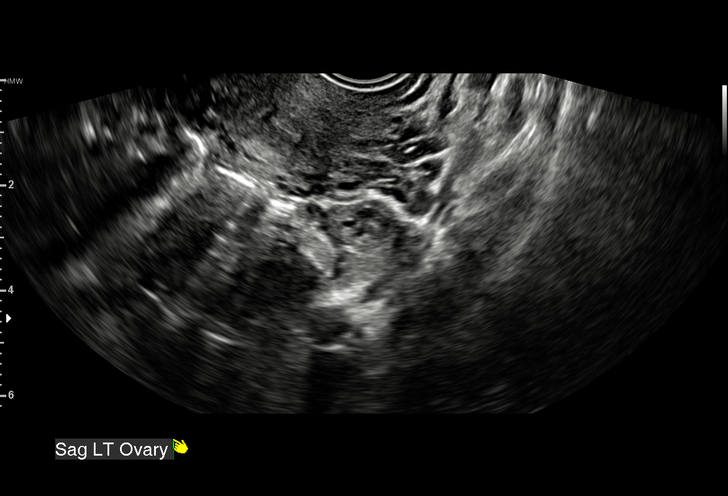
[im 50/54]
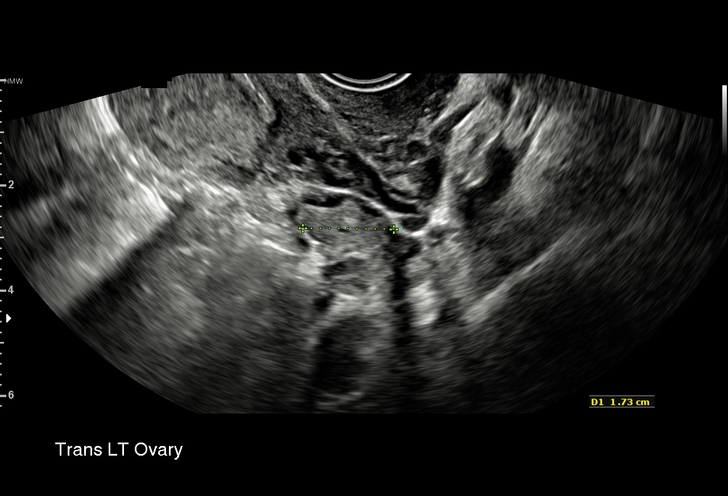
[im 54/54]
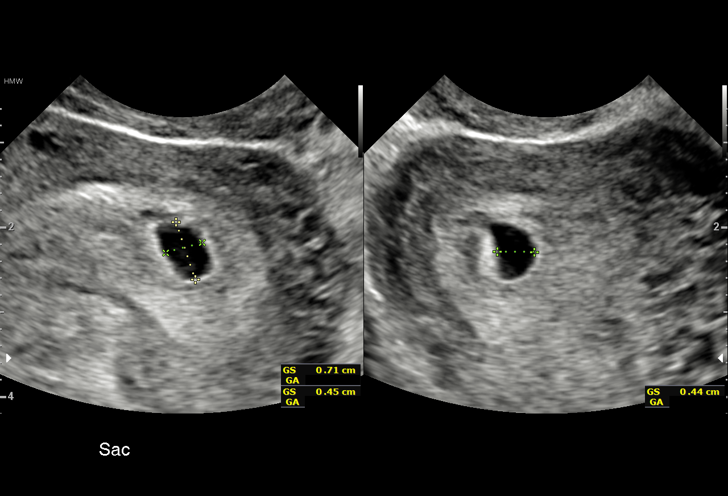

[15 of 28 positions shown; findings below may reference images not displayed]

FINDINGS: Intrauterine gestational sac: Single

Yolk sac:  Not Visualized.

Embryo:  Not Visualized.

Cardiac Activity: Not Visualized.

Heart Rate: 0  bpm

MSD: 5.3  mm   5 w   2  d

Subchorionic hemorrhage:  None visualized.

Maternal uterus/adnexae: Corpus luteum right ovary. Normal left
ovary. Trace free fluid in the pelvis.
IMPRESSION: Probable early intrauterine gestational sac, but no yolk sac, fetal
pole, or cardiac activity yet visualized. Recommend follow-up
quantitative B-HCG levels and follow-up US in 14 days to assess
viability. This recommendation follows SRU consensus guidelines:
Diagnostic Criteria for Nonviable Pregnancy Early in the First
Trimester. N Engl J Med 4139; [DATE].

## 2018-12-16 IMAGING — US US OB TRANSVAGINAL
2 series · 15 of 28 positions shown · non-contrast
Comparison: 10/04/2017

CLINICAL DATA: Early pregnancy. Unknown location. Followup
10/04/2017

EXAM:
OBSTETRIC <14 WK ULTRASOUND
TECHNIQUE: Transabdominal ultrasound was performed for evaluation of the
gestation as well as the maternal uterus and adnexal regions.

[Series 1: us ob transvaginal · 46 acquisitions, 14 frames shown (1 of 2)]
[im 1/46]
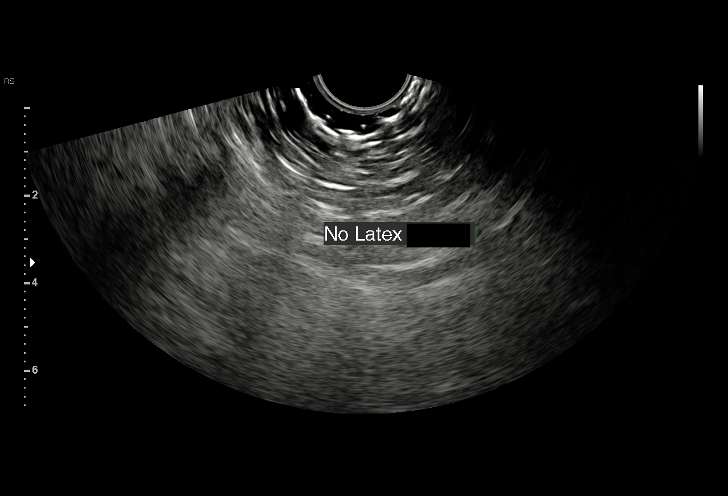
[im 4/46]
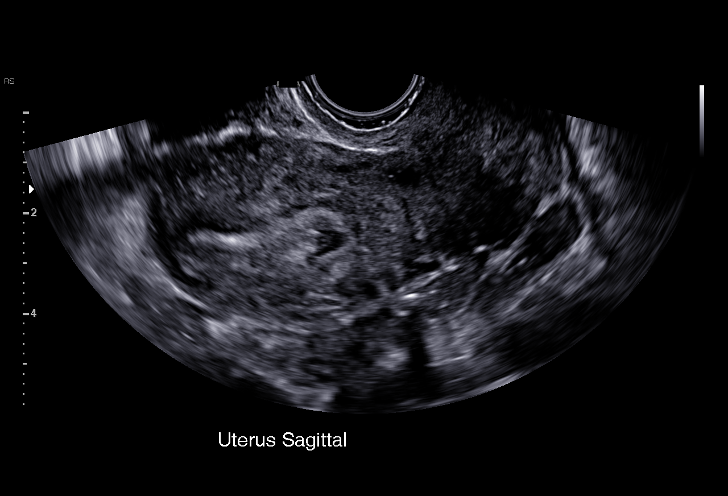
[im 7/46]
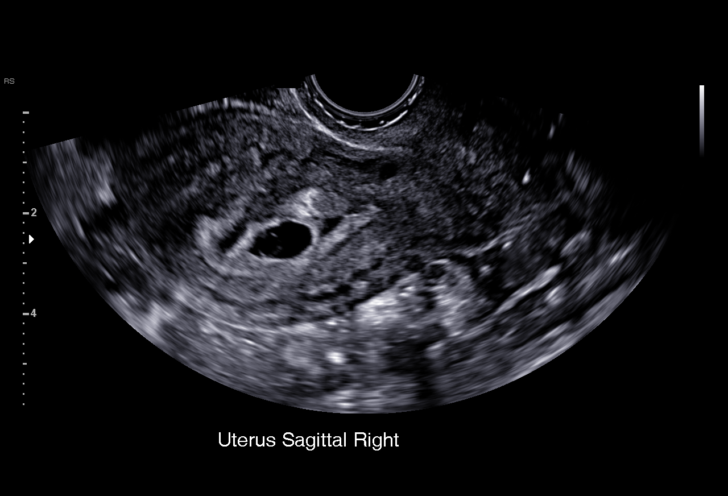
[im 11/46]
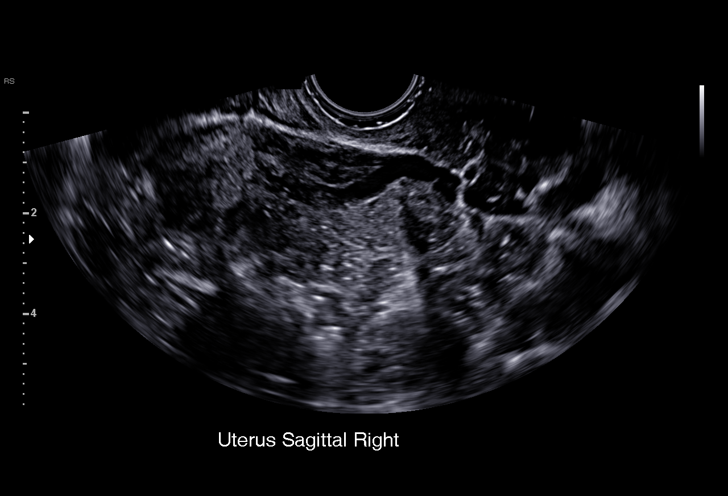
[im 14/46]
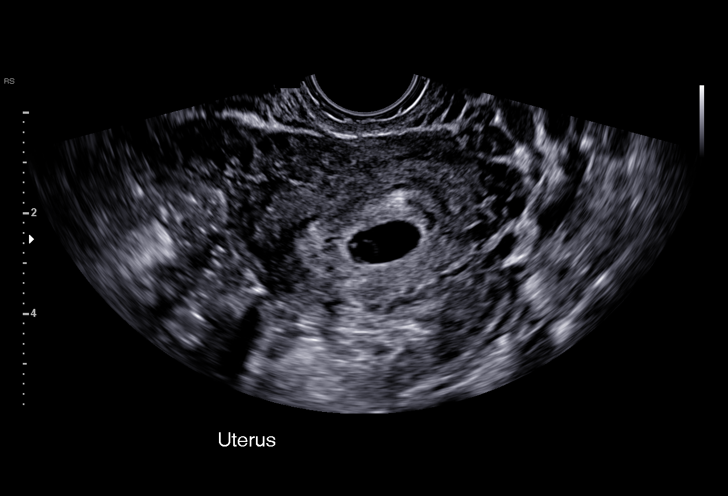
[im 18/46]
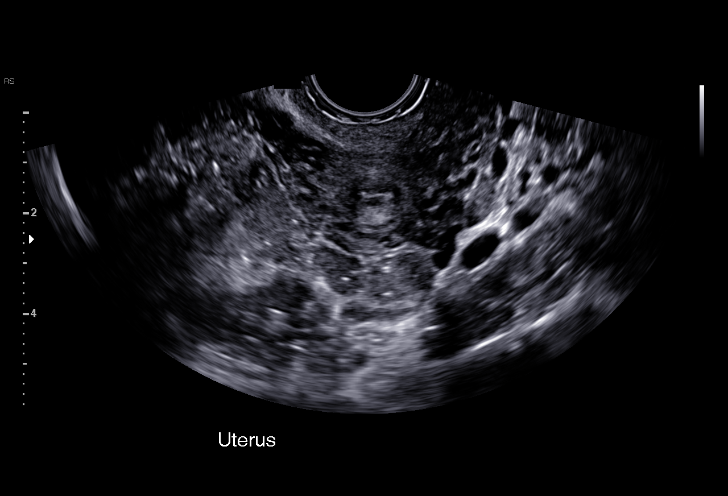
[im 21/46]
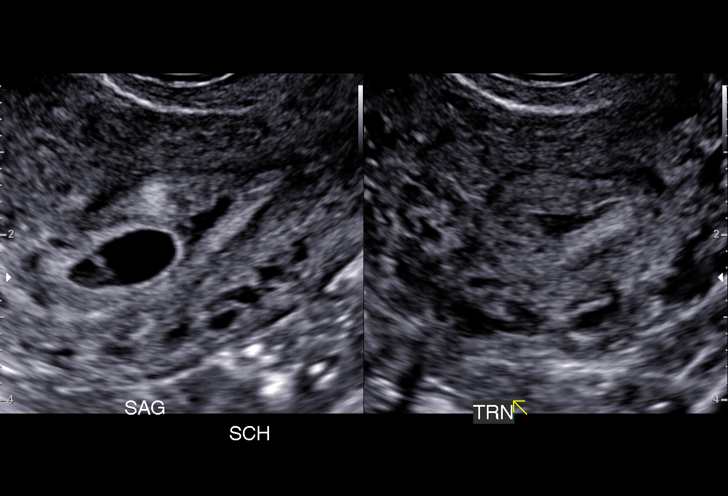
[im 25/46]
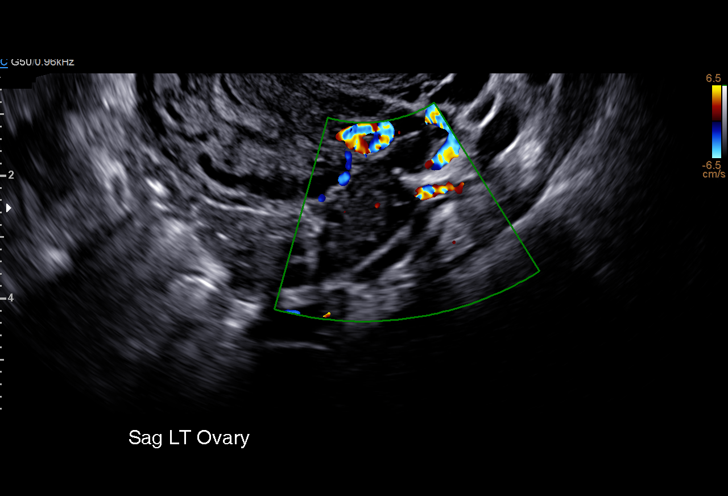
[im 26/46]
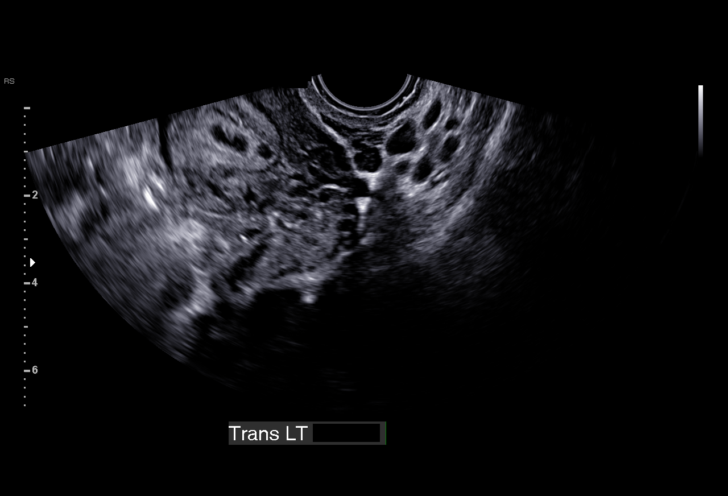
[im 30/46]
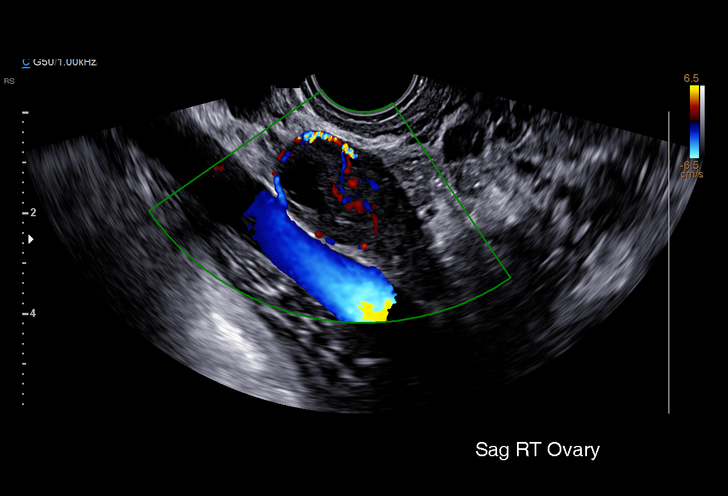
[im 33/46]
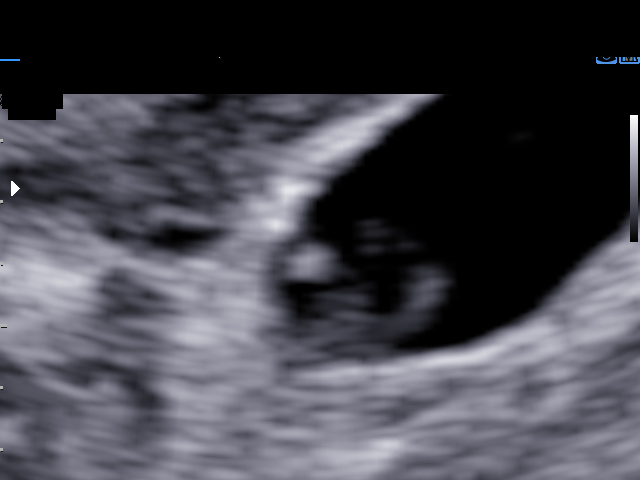
[im 37/46]
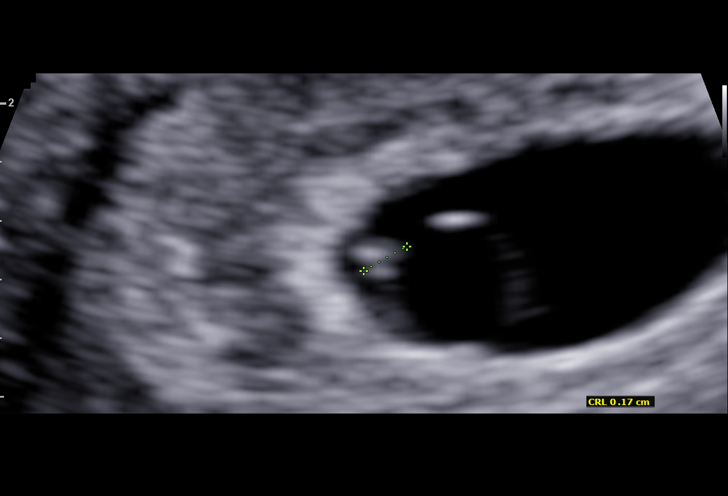
[im 40/46]
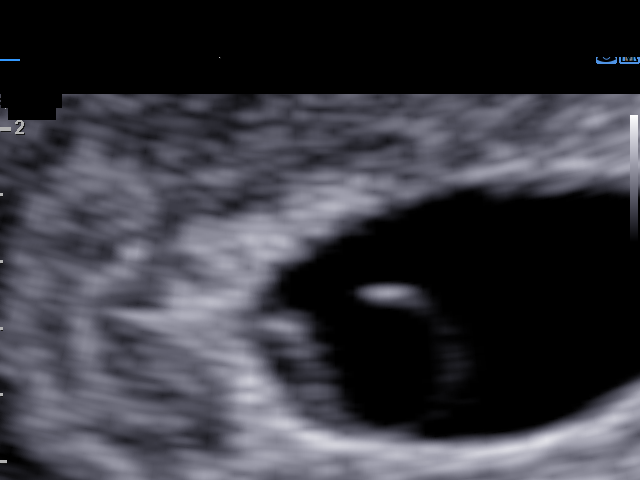
[im 44/46]
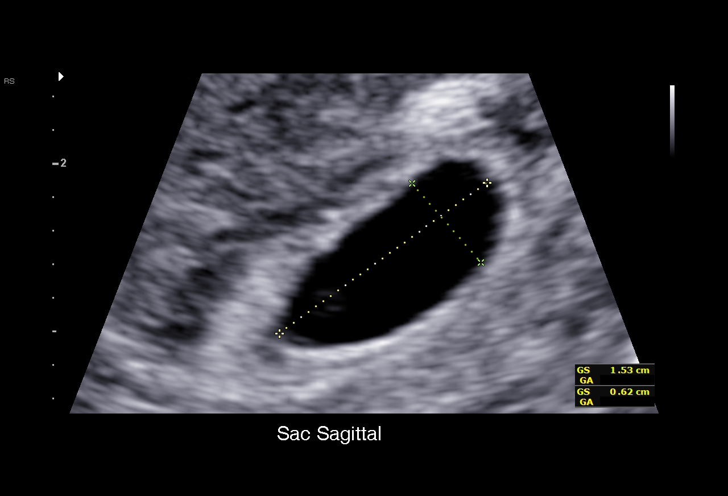

[Series 2: us ob transvaginal · 1 of 1 slices shown (2 of 2)]
[im 1/1]
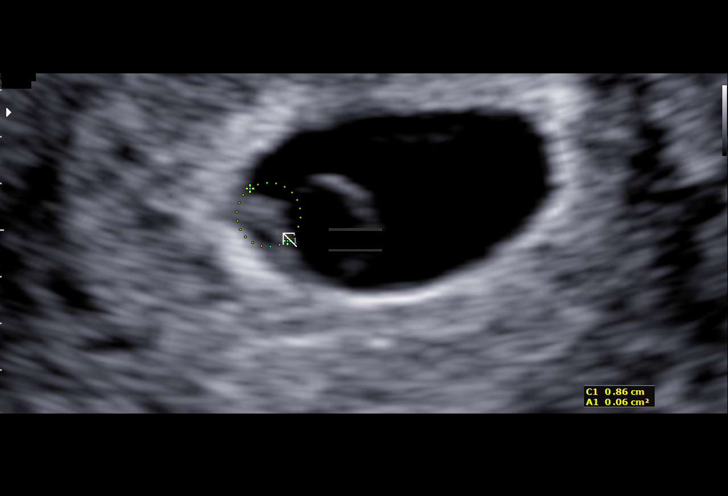

[15 of 28 positions shown; findings below may reference images not displayed]

FINDINGS: Intrauterine gestational sac: Present

Yolk sac:  Present

Embryo:  Present

Cardiac Activity: Present

Heart Rate: 97 bpm

MSD: 1.13 cm    5 w   6 d

Subchorionic hemorrhage:  Tiny amount of subchorionic hemorrhage.

Maternal uterus/adnexae: Both ovaries appear normal, with small
corpus luteal cyst on the right. No free fluid.
IMPRESSION: Living single intrauterine gestation at 5 weeks 6 days by mean sac
diameter. Tiny amount of subchorionic fluid/blood.

## 2019-03-11 ENCOUNTER — Encounter (HOSPITAL_COMMUNITY): Payer: Self-pay

## 2019-03-11 ENCOUNTER — Other Ambulatory Visit: Payer: Self-pay

## 2019-03-11 ENCOUNTER — Inpatient Hospital Stay (HOSPITAL_COMMUNITY)
Admission: AD | Admit: 2019-03-11 | Discharge: 2019-03-11 | Disposition: A | Discharge status: Home / Self Care | Payer: Medicaid Other | Attending: Obstetrics and Gynecology | Admitting: Obstetrics and Gynecology

## 2019-03-11 ENCOUNTER — Inpatient Hospital Stay (HOSPITAL_COMMUNITY): Payer: Medicaid Other

## 2019-03-11 DIAGNOSIS — Z3A19 19 weeks gestation of pregnancy: Secondary | ICD-10-CM | POA: Diagnosis not present

## 2019-03-11 DIAGNOSIS — O99891 Other specified diseases and conditions complicating pregnancy: Secondary | ICD-10-CM

## 2019-03-11 DIAGNOSIS — O9989 Other specified diseases and conditions complicating pregnancy, childbirth and the puerperium: Secondary | ICD-10-CM

## 2019-03-11 DIAGNOSIS — R109 Unspecified abdominal pain: Secondary | ICD-10-CM

## 2019-03-11 DIAGNOSIS — Z87891 Personal history of nicotine dependence: Secondary | ICD-10-CM | POA: Insufficient documentation

## 2019-03-11 DIAGNOSIS — M549 Dorsalgia, unspecified: Secondary | ICD-10-CM | POA: Diagnosis not present

## 2019-03-11 DIAGNOSIS — O26872 Cervical shortening, second trimester: Secondary | ICD-10-CM | POA: Diagnosis not present

## 2019-03-11 DIAGNOSIS — O26892 Other specified pregnancy related conditions, second trimester: Secondary | ICD-10-CM | POA: Diagnosis not present

## 2019-03-11 DIAGNOSIS — O26879 Cervical shortening, unspecified trimester: Secondary | ICD-10-CM

## 2019-03-11 DIAGNOSIS — R1030 Lower abdominal pain, unspecified: Secondary | ICD-10-CM | POA: Insufficient documentation

## 2019-03-11 DIAGNOSIS — O26899 Other specified pregnancy related conditions, unspecified trimester: Secondary | ICD-10-CM

## 2019-03-11 LAB — URINALYSIS, ROUTINE W REFLEX MICROSCOPIC
Bilirubin Urine: NEGATIVE
Glucose, UA: NEGATIVE mg/dL
Hgb urine dipstick: NEGATIVE
Ketones, ur: 5 mg/dL — AB
Leukocytes,Ua: NEGATIVE
Nitrite: NEGATIVE
Protein, ur: NEGATIVE mg/dL
Specific Gravity, Urine: 1.021 (ref 1.005–1.030)
pH: 9 — ABNORMAL HIGH (ref 5.0–8.0)

## 2019-03-11 LAB — CBC
HCT: 34.3 % — ABNORMAL LOW (ref 36.0–46.0)
Hemoglobin: 11.2 g/dL — ABNORMAL LOW (ref 12.0–15.0)
MCH: 29.8 pg (ref 26.0–34.0)
MCHC: 32.7 g/dL (ref 30.0–36.0)
MCV: 91.2 fL (ref 80.0–100.0)
Platelets: 272 10*3/uL (ref 150–400)
RBC: 3.76 MIL/uL — ABNORMAL LOW (ref 3.87–5.11)
RDW: 13.7 % (ref 11.5–15.5)
WBC: 8.4 10*3/uL (ref 4.0–10.5)
nRBC: 0 % (ref 0.0–0.2)

## 2019-03-11 LAB — WET PREP, GENITAL
Sperm: NONE SEEN
Trich, Wet Prep: NONE SEEN
Yeast Wet Prep HPF POC: NONE SEEN

## 2019-03-11 LAB — HCG, QUANTITATIVE, PREGNANCY: hCG, Beta Chain, Quant, S: 7839 m[IU]/mL — ABNORMAL HIGH (ref <5)

## 2019-03-11 NOTE — MAU Provider Note (Signed)
History     CSN: [id number]  Arrival date and time: 03/11/19 1626   First Provider Initiated Contact with Patient 03/11/19 1725      Chief Complaint  Patient presents with  . Abdominal Pain  . Back Pain   Ms. Christine Lambert is a 23 y.o. G3P2002 at 3585w6d who presents to MAU for lower abdominal and LBP. LMP 12/2018.  Onset: 2 weeks ago, pt reports she experienced a fall around this same time Location: lower abdomen and LBP Duration: 2 weeks Character: intermittent, "kind of like contractions," getting worse over time, hard to walk when pain hits, pain comes randomly, denies sensation of round ligament pain Aggravating/Associated: none Relieving: none Treatment: warm water - helps at time, but then pain returns Severity: 5/10 when it occurs, otherwise 0/10 because pain is not always present  Pt denies VB, vaginal discharge/odor/itching. Pt denies N/V, constipation, diarrhea, or urinary problems. Pt denies fever, chills, fatigue, sweating or changes in appetite. Pt denies SOB or chest pain. Pt denies dizziness, HA, light-headedness, weakness.  Problems this pregnancy include: has not yet been seen this pregnancy, denies issues in previous pregnancy. Allergies? Nickel, NKDA Current medications/supplements? PNVs Prenatal care provider? None, [facility] AM [facility]B appt   OB History    Gravida  3   Para  2   Term  2   Preterm  0   AB  0   Living  2     SAB  0   TAB  0   Ectopic  0   Multiple  0   Live Births  2           Past Medical History:  Diagnosis Date  . 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 Headache    migraines    Past Surgical History:  Procedure Laterality Date  . NO PAST SURGERIES      History reviewed. No pertinent family history.  Social History   Tobacco Use  . Smoking status: Former Smoker    Last attempt to quit: 08/18/2015    Years since quitting: 3.5  . Smokeless  tobacco: Never Used  Substance Use Topics  . Alcohol use: No  . Drug use: No    Allergies:  Allergies  Allergen Reactions  . Nickel Rash    No medications prior to admission.    Review of Systems  Constitutional: Negative for chills, diaphoresis, fatigue and fever.  Respiratory: Negative for shortness of breath.   Cardiovascular: Negative for chest pain.  Gastrointestinal: Positive for abdominal pain. Negative for constipation, diarrhea, nausea and vomiting.  Genitourinary: Negative for dysuria, flank pain, frequency, pelvic pain, urgency, vaginal bleeding and vaginal discharge.  Musculoskeletal: Positive for back pain.  Neurological: Negative for dizziness, weakness, light-headedness and headaches.   Physical Exam   Blood pressure (!) 98/57, pulse 78, temperature 98.4 F (36.9 C), temperature source Oral, resp. rate 16, last menstrual period 12/25/2018, SpO2 99 %, unknown if currently breastfeeding.  Patient Vitals for the past 24 hrs:  BP Temp Temp src Pulse Resp SpO2  03/11/19 1923 (!) 98/57 - - 78 16 99 %  03/11/19 1656 (!) 91/55 98.4 F (36.9 C) Oral 82 16 99 %   Physical Exam  Constitutional: She is oriented to person, place, and time. She appears well-developed and well-nourished. No distress.  HENT:  Head: Normocephalic and atraumatic.  Respiratory: Effort normal.  GI: Soft. She exhibits no distension and no mass. There  is no abdominal tenderness. There is no rebound and no guarding.  Genitourinary: There is no rash, tenderness or lesion on the right labia. There is no rash, tenderness or lesion on the left labia. Uterus is enlarged. Uterus is not fixed and not tender. Cervix exhibits no motion tenderness, no discharge and no friability. Right adnexum displays no mass, no tenderness and no fullness. Left adnexum displays no mass, no tenderness and no fullness.    No vaginal discharge, tenderness or bleeding.  No tenderness or bleeding in the vagina.    Genitourinary  Comments: FH: at umbilicus CE: closed/long/posterior   Neurological: She is alert and oriented to person, place, and time.  Skin: Skin is warm and dry. She is not diaphoretic.  Psychiatric: She has a normal mood and affect. Her behavior is normal. Judgment and thought content normal.   Results for orders placed or performed during the hospital encounter of 03/11/19 (from the past 24 hour(s))  Urinalysis, Routine w reflex microscopic     Status: Abnormal   Collection Time: 03/11/19  5:26 PM  Result Value Ref Range   Color, Urine YELLOW YELLOW   APPearance CLEAR CLEAR   Specific Gravity, Urine 1.021 1.005 - 1.030   pH 9.0 (H) 5.0 - 8.0   Glucose, UA NEGATIVE NEGATIVE mg/dL   Hgb urine dipstick NEGATIVE NEGATIVE   Bilirubin Urine NEGATIVE NEGATIVE   Ketones, ur 5 (A) NEGATIVE mg/dL   Protein, ur NEGATIVE NEGATIVE mg/dL   Nitrite NEGATIVE NEGATIVE   Leukocytes,Ua NEGATIVE NEGATIVE  CBC     Status: Abnormal   Collection Time: 03/11/19  5:28 PM  Result Value Ref Range   WBC 8.4 4.0 - 10.5 K/uL   RBC 3.76 (L) 3.87 - 5.11 MIL/uL   Hemoglobin 11.2 (L) 12.0 - 15.0 g/dL   HCT 16.134.3 (L) 09.636.0 - 04.546.0 %   MCV 91.2 80.0 - 100.0 fL   MCH 29.8 26.0 - 34.0 pg   MCHC 32.7 30.0 - 36.0 g/dL   RDW 40.913.7 81.111.5 - 91.415.5 %   Platelets 272 150 - 400 K/uL   nRBC 0.0 0.0 - 0.2 %  hCG, quantitative, pregnancy     Status: Abnormal   Collection Time: 03/11/19  5:28 PM  Result Value Ref Range   hCG, Beta Chain, Quant, S 7,839 (H) <5 mIU/mL  Wet prep, genital     Status: Abnormal   Collection Time: 03/11/19  5:42 PM  Result Value Ref Range   Yeast Wet Prep HPF POC NONE SEEN NONE SEEN   Trich, Wet Prep NONE SEEN NONE SEEN   Clue Cells Wet Prep HPF POC PRESENT (A) NONE SEEN   WBC, Wet Prep HPF POC MANY (A) NONE SEEN   Sperm NONE SEEN    No results found.  MAU Course  Procedures  MDM -abdominal pain in pregnancy, likely 20wks based on FH, r/o PTL -CE: long/closed/posterior -UA: pH 9.0/5ketones,  otherwise WNL -CBC: WNL for pregnancy -US: CL 2.82cm, otherwise WNL -hCG: 7,839 -ABO: B Positive -WetPrep: many WBCs, +ClueCells (isolated finding not requiring treatment), otherwise WNL -GC/CT collected -spoke with Dr. Alysia PennaErvin re: CL, per Dr. Alysia PennaErvin, needs f/u US in 2wks for cervical length, US order entered at discharge -when provider entered room to discuss results, pt continues to report pain as 0/10 -pt discharged to home in stable condition  Orders Placed This Encounter  Procedures  . Wet prep, genital    Standing Status:   Standing    Number of Occurrences:  1  . [place] MFM OB LIMITED    Standing Status:   Standing    Number of Occurrences:   1    Order Specific Question:   Symptom/Reason for Exam    Answer:   Abdominal pain in pregnancy [[id number]]  . [place] MFM OB LIMITED    Please evaluate cervical length.    Standing Status:   Future    Standing Expiration Date:   05/10/2020    Order Specific Question:   Reason for Exam (SYMPTOM  OR DIAGNOSIS REQUIRED)    Answer:   cervical shortening affecting pregnancy    Order Specific Question:   Preferred Location    Answer:   [facility]  . Urinalysis, Routine w reflex microscopic    Standing Status:   Standing    Number of Occurrences:   1  . CBC    Standing Status:   Standing    Number of Occurrences:   1  . hCG, quantitative, pregnancy    Standing Status:   Standing    Number of Occurrences:   1  . Discharge patient    Order Specific Question:   Discharge disposition    Answer:   01-Home or Self Care [1]    Order Specific Question:   Discharge patient date    Answer:   03/11/2019   No orders of the defined types were placed in this encounter.  Assessment and Plan   1. Abdominal pain in pregnancy   2. Back pain in pregnancy   3. [redacted] weeks gestation of pregnancy   4. Cervical shortening affecting pregnancy    Allergies as of 03/11/2019      Reactions   Nickel Rash      Medication List     TAKE these medications   albuterol 108 (90 Base) MCG/ACT inhaler Commonly known as:  VENTOLIN HFA Inhale 2 puffs into the lungs every 4 (four) hours as needed for wheezing or shortness of breath.   benzonatate 100 MG capsule Commonly known as:  TESSALON Take 1 capsule (100 mg total) by mouth every 8 (eight) hours.   diphenhydrAMINE 25 MG tablet Commonly known as:  BENADRYL Take 25 mg by mouth at bedtime as needed for allergies.   multivitamin-prenatal 27-0.8 MG Tabs tablet Take 1 tablet by mouth daily at 12 noon.   polyethylene glycol powder 17 GM/SCOOP powder Commonly known as:  MiraLax Take 17 g by mouth daily.      -pt encouraged to make f/u appt with PCP to discuss back pain and relationship to fall 2 weeks ago -strict PTL/return MAU precautions given -discussed impt of f/u US in 2wks to evaluate cervical length -pt instructed to call OB on Monday at 8am to schedule NOB -will call with culture results, if positive -pt discharged to home in stable condition  Elmyra Ricks E  03/11/2019, 7:35 PM

## 2019-03-11 NOTE — MAU Note (Signed)
Christine Lambert is a 23 y.o. at [redacted]w[redacted]d here in MAU reporting: intermittent abdominal and back pain, the pains happen at the same. States the pains are random and nothing makes them better or worse. She gets the pains daily for 2 weeks now. Had some spotting a few weeks ago but none currently. No abnormal vaginal discharge.  LMP: 12/25/18 approximately   Onset of complaint: ongoing  Pain score: 5/10  Vitals:   03/11/19 1656  BP: (!) 91/55  Pulse: 82  Resp: 16  Temp: 98.4 F (36.9 C)  SpO2: 99%     FHT:160  Lab orders placed from triage: UA, UPT cancelled due to [facility]

## 2019-03-11 NOTE — Discharge Instructions (Signed)
Abdominal Pain During Pregnancy  Abdominal pain is common during pregnancy, and has many possible causes. Some causes are more serious than others, and sometimes the cause is not known. Abdominal pain can be a sign that labor is starting. It can also be caused by normal growth and stretching of muscles and ligaments during pregnancy. Always tell your health care provider if you have any abdominal pain. Follow these instructions at home:  Do not have sex or put anything in your vagina until your pain goes away completely.  Get plenty of rest until your pain improves.  Drink enough fluid to keep your urine pale yellow.  Take over-the-counter and prescription medicines only as told by your health care provider.  Keep all follow-up visits as told by your health care provider. This is important. Contact a health care provider if:  Your pain continues or gets worse after resting.  You have lower abdominal pain that: ? Comes and goes at regular intervals. ? Spreads to your back. ? Is similar to menstrual cramps.  You have pain or burning when you urinate. Get help right away if:  You have a fever or chills.  You have vaginal bleeding.  You are leaking fluid from your vagina.  You are passing tissue from your vagina.  You have vomiting or diarrhea that lasts for more than 24 hours.  Your baby is moving less than usual.  You feel very weak or faint.  You have shortness of breath.  You develop severe pain in your upper abdomen. Summary  Abdominal pain is common during pregnancy, and has many possible causes.  If you experience abdominal pain during pregnancy, tell your health care provider right away.  Follow your health care provider's home care instructions and keep all follow-up visits as directed. This information is not intended to replace advice given to you by your health care provider. Make sure you discuss any questions you have with your health care  provider. Document Released: 09/21/2005 Document Revised: 12/24/2016 Document Reviewed: 12/24/2016 Elsevier Interactive Patient Education  2019 [employer]. Preterm Labor and Birth Information  The normal length of a pregnancy is 39-41 weeks. Preterm labor is when labor starts before 37 completed weeks of pregnancy. What are the risk factors for preterm labor? Preterm labor is more likely to occur in women who:  Have certain infections during pregnancy such as a bladder infection, sexually transmitted infection, or infection inside the uterus (chorioamnionitis).  Have a shorter-than-normal cervix.  Have gone into preterm labor before.  Have had surgery on their cervix.  Are younger than age 90 or older than age 42.  Are African American.  Are pregnant with twins or multiple babies (multiple gestation).  Take street drugs or smoke while pregnant.  Do not gain enough weight while pregnant.  Became pregnant shortly after having been pregnant. What are the symptoms of preterm labor? Symptoms of preterm labor include:  Cramps similar to those that can happen during a menstrual period. The cramps may happen with diarrhea.  Pain in the abdomen or lower back.  Regular uterine contractions that may feel like tightening of the abdomen.  A feeling of increased pressure in the pelvis.  Increased watery or bloody mucus discharge from the vagina.  Water breaking (ruptured amniotic sac). Why is it important to recognize signs of preterm labor? It is important to recognize signs of preterm labor because babies who are born prematurely may not be fully developed. This can put them at an increased  risk for:  Long-term (chronic) heart and lung problems.  Difficulty immediately after birth with regulating body systems, including blood sugar, body temperature, heart rate, and breathing rate.  Bleeding in the brain.  Cerebral palsy.  Learning difficulties.  Death. These risks are  highest for babies who are born before 34 weeks of pregnancy. How is preterm labor treated? Treatment depends on the length of your pregnancy, your condition, and the health of your baby. It may involve:  Having a stitch (suture) placed in your cervix to prevent your cervix from opening too early (cerclage).  Taking or being given medicines, such as: ? Hormone medicines. These may be given early in pregnancy to help support the pregnancy. ? Medicine to stop contractions. ? Medicines to help mature the babys lungs. These may be prescribed if the risk of delivery is high. ? Medicines to prevent your baby from developing cerebral palsy. If the labor happens before 34 weeks of pregnancy, you may need to stay in the hospital. What should I do if I think I am in preterm labor? If you think that you are going into preterm labor, call your health care provider right away. How can I prevent preterm labor in future pregnancies? To increase your chance of having a full-term pregnancy:  Do not use any tobacco products, such as cigarettes, chewing tobacco, and e-cigarettes. If you need help quitting, ask your health care provider.  Do not use street drugs or medicines that have not been prescribed to you during your pregnancy.  Talk with your health care provider before taking any herbal supplements, even if you have been taking them regularly.  Make sure you gain a healthy amount of weight during your pregnancy.  Watch for infection. If you think that you might have an infection, get it checked right away.  Make sure to tell your health care provider if you have gone into preterm labor before. This information is not intended to replace advice given to you by your health care provider. Make sure you discuss any questions you have with your health care provider. Document Released: 12/12/2003 Document Revised: 03/03/2016 Document Reviewed: 02/12/2016 Elsevier Interactive Patient Education  2019  Elsevier Inc. Cervical Insufficiency Cervical insufficiency is when the cervix is weak and starts to open (dilate) and thin (efface) before the pregnancy is at term and before labor starts. This is also called incompetent cervix. It can happen during the second or third trimester when the fetus starts putting pressure on the cervix. Treatment may reduce the risk of problems for you and your baby. Cervical insufficiency can lead to:  Loss of the baby (miscarriage).  Breaking of the sac that holds the baby (amniotic sac). This is also called preterm premature rupture of the membranes,PPROM.  The baby being born early (preterm birth). What are the causes? The cause of this condition is not well known. However, it may be caused by abnormalities in the cervix and other factors such as inflammation or infection. What increases the risk? This condition is more likely to develop if:  You have a shorter cervix than normal.  Your cervix was damaged or injured during a past pregnancy or surgery.  You were born with a cervical defect.  You have had a procedure done on the cervix, such as cervical biopsy.  You have a history of: ? Cervical insufficiency. ? PPROM.  You have ended several pregnancies through abortion.  You were exposed to the drug diethylstilbestrol (DES). What are the signs or symptoms?  Symptoms of this condition can vary. Sometimes there no symptoms for this condition, and at other times there are mild symptoms that start between weeks 14 and 20 of pregnancy. The symptoms may last several days or weeks. These symptoms include:  Light spotting or bleeding from the vagina.  Pelvic pressure.  A change in vaginal discharge, such as changes from clear, white, or light yellow to pink or tan.  Back pain.  Abdominal pain or cramping. How is this diagnosed? This condition may be diagnosed based on:  Your symptoms.  Your medical history, including: ? Any problems during past  pregnancies, such as miscarriages. ? Any procedures performed on your cervix. ? Any history of cervical insufficiency. During the second trimester, cervical insufficiency may be diagnosed based on:  An ultrasound done with a probe inserted into your vagina (transvaginal ultrasound).  A pelvic exam.  Tests of fluid in the amniotic sac. This is done to rule out infection. How is this treated? This condition may be managed by:  Limiting physical activity.  Limiting activity at home or in the hospital.  Pelvic rest. This means that there should be no sexual intercourse or placing anything in the vagina.  A procedure to sew the cervix closed and prevent it from opening too early (cerclage). The stitches (sutures) are removed between weeks 36 and 38 to avoid problems during labor. Cerclage may be recommended if: ? You have a history of miscarriages or preterm births without a known cause. ? You have a short cervix. A short cervix is identified by ultrasound. ? Your cervix has dilated before 24 weeks of pregnancy. Follow these instructions at home:  Get plenty of rest and lessen activity as told by your health care provider. Ask your health care provider what activities are safe for you.  If pelvic rest was recommended, you shouldnot have sex, use tampons, douche, or place anything inside your vagina until your health care provider says that this is okay.  Take over-the-counter and prescription medicines only as told by your health care provider.  Keep all follow-up visits and prenatal visits as told by your health care provider. This is important. Get help right away if:  You have vaginal bleeding, even if it is a small amount or even if it is painless.  You have pain in your abdomen or your lower back.  You have a feeling of increased pressure in your pelvis.  You have vaginal discharge that changes from clear, white, or light yellow to pink or tan.  You have a fever.  You have  severe nausea or vomiting. Summary  Cervical insufficiency is when the cervix is weak and starts to dilate and efface before the pregnancy is at term and before labor starts.  Symptoms of this condition can vary from no symptoms to mild symptoms that start between weeks 14 and 20 of pregnancy. The symptoms may last several days or weeks.  This condition may be managed by limiting physical activity, having pelvic rest, or having cervical cerclage.  If pelvic rest was recommended, you should not have sex, use tampons, use a douche, or place anything inside your vagina until your health care provider says that this is okay. This information is not intended to replace advice given to you by your health care provider. Make sure you discuss any questions you have with your health care provider. Document Released: 09/21/2005 Document Revised: 09/24/2016 Document Reviewed: 09/24/2016 Elsevier Interactive Patient Education  2019 [employer] [facility] [facility]  [facility]     Phone: [phone]  [facility]  Phone: [phone]  [facility] [place]  Phone: [phone]  [facility]                           Phone: [phone]  [facility]          Phone: [phone]  [facility]    Phone: [phone]   [facility]    Phone: [phone]  [facility]    Phone: [phone]  [facility]    Phone: [phone]  [facility]    Phone: [phone]  [facility]  Phone: [phone]  [facility]               Phone: [phone]  [facility]   Phone: [phone]  [facility]    Phone: [phone]

## 2019-03-13 LAB — GC/CHLAMYDIA PROBE AMP (~~LOC~~) NOT AT ARMC
Chlamydia: NEGATIVE
Neisseria Gonorrhea: NEGATIVE

## 2019-03-15 ENCOUNTER — Telehealth: Payer: Self-pay

## 2019-03-15 NOTE — Telephone Encounter (Signed)
Called pt and pt was informed was short cervix that was 2.8 cm and she was told that we would schedule an [place] in two weeks.  I explained to the pt that once she has her NEW OB intake with a nurse they will determine her needs and schedule at that point.  Pt verbalized understanding.

## 2019-03-15 NOTE — Telephone Encounter (Signed)
The patient is worried about the information provided to her. She stated she visited the ER and was told she has a short cervix. She would like a nurse to review the chart and give her call to be sure the baby is ok.

## 2019-03-21 ENCOUNTER — Telehealth: Payer: Self-pay | Admitting: Family Medicine

## 2019-03-21 NOTE — Telephone Encounter (Signed)
Spoke with patient about her appointment on 6/17 @ 9:15. Patient was instructed that it will be a mychart visit and she verbalized that she has the app downloaded and knows how to access the appointment. Patient was not screened because it is a virtual appointment.

## 2019-03-22 ENCOUNTER — Telehealth (INDEPENDENT_AMBULATORY_CARE_PROVIDER_SITE_OTHER): Payer: Self-pay | Admitting: *Deleted

## 2019-03-22 ENCOUNTER — Other Ambulatory Visit: Payer: Self-pay

## 2019-03-22 DIAGNOSIS — O26872 Cervical shortening, second trimester: Secondary | ICD-10-CM

## 2019-03-22 DIAGNOSIS — O0990 Supervision of high risk pregnancy, unspecified, unspecified trimester: Secondary | ICD-10-CM | POA: Insufficient documentation

## 2019-03-22 NOTE — Progress Notes (Signed)
I connected with  [name] on 03/22/19 at  9:15 AM EDT by telephone and verified that I am speaking with the correct person using two identifiers.   I discussed the limitations, risks, security and privacy concerns of performing an evaluation and management service by telephone and virtually and the availability of in person appointments. I also discussed with the patient that there may be a patient responsible charge related to this service. The patient expressed understanding and agreed to proceed. We confirmed she had a period 12/25/18 but it wasn't her normal period- didn't last as long. She has had a dating [place].  I reviewed her new ob appt  And to wear a mask. I sent her [employer] app- she thinks she has a bp cuff and will let us know if she doesn't. I explained we will have her take her bp weekly and enter into [employer] app. She currently has family planning medicaid and is going to apply to get it changed to pregnancy medicaid. I also explained we will order [place] asap for anatomy and that she will get a Mychart notification.    Johari Bennetts,RN 03/22/2019  9:45 AM

## 2019-03-30 ENCOUNTER — Ambulatory Visit (HOSPITAL_COMMUNITY)
Admission: RE | Admit: 2019-03-30 | Discharge: 2019-03-30 | Disposition: A | Discharge status: Home / Self Care | Payer: Medicaid Other | Source: Ambulatory Visit | Attending: Obstetrics and Gynecology | Admitting: Obstetrics and Gynecology

## 2019-03-30 ENCOUNTER — Other Ambulatory Visit: Payer: Self-pay

## 2019-03-30 DIAGNOSIS — O26872 Cervical shortening, second trimester: Secondary | ICD-10-CM | POA: Diagnosis not present

## 2019-03-30 DIAGNOSIS — Z363 Encounter for antenatal screening for malformations: Secondary | ICD-10-CM

## 2019-03-30 DIAGNOSIS — O9989 Other specified diseases and conditions complicating pregnancy, childbirth and the puerperium: Secondary | ICD-10-CM

## 2019-03-30 DIAGNOSIS — O0990 Supervision of high risk pregnancy, unspecified, unspecified trimester: Secondary | ICD-10-CM

## 2019-03-30 DIAGNOSIS — Z3A22 22 weeks gestation of pregnancy: Secondary | ICD-10-CM

## 2019-04-04 ENCOUNTER — Telehealth: Payer: Self-pay | Admitting: Advanced Practice Midwife

## 2019-04-04 NOTE — Telephone Encounter (Signed)
Called the patient to pre-screen. Left a detailed voicemail informing if the patient is experiencing any flu-like symptoms such as fever, chills, cough, or shortness of breath and/or has been in contact with anyone who is suspected of/or confirmed to have COVID19 please call back to reschedule the appointment. Upon entering the office please wear a face mask, sanitize hands, and no visitors or children due to COVID19 restrictions. °

## 2019-04-05 ENCOUNTER — Encounter: Payer: Self-pay | Admitting: Obstetrics and Gynecology

## 2019-04-05 ENCOUNTER — Other Ambulatory Visit: Payer: Self-pay

## 2019-04-05 ENCOUNTER — Other Ambulatory Visit (HOSPITAL_COMMUNITY)
Admission: RE | Admit: 2019-04-05 | Discharge: 2019-04-05 | Disposition: A | Discharge status: Home / Self Care | Payer: Medicaid Other | Source: Ambulatory Visit | Attending: Obstetrics and Gynecology | Admitting: Obstetrics and Gynecology

## 2019-04-05 ENCOUNTER — Ambulatory Visit (INDEPENDENT_AMBULATORY_CARE_PROVIDER_SITE_OTHER): Payer: Self-pay | Admitting: Obstetrics and Gynecology

## 2019-04-05 DIAGNOSIS — Z3482 Encounter for supervision of other normal pregnancy, second trimester: Secondary | ICD-10-CM

## 2019-04-05 DIAGNOSIS — Z3A23 23 weeks gestation of pregnancy: Secondary | ICD-10-CM

## 2019-04-05 DIAGNOSIS — Z3480 Encounter for supervision of other normal pregnancy, unspecified trimester: Secondary | ICD-10-CM | POA: Insufficient documentation

## 2019-04-05 MED ORDER — AMBULATORY NON FORMULARY MEDICATION: 1.0000 | 0 refills | Status: DC

## 2019-04-05 NOTE — Addendum Note (Signed)
Addended by: Bethanne Ginger on: 04/05/2019 03:03 PM   Modules accepted: Orders

## 2019-04-05 NOTE — Progress Notes (Signed)
Subjective:  Christine Lambert is a 23 y.o. G3P2002 at [redacted]w[redacted]d being seen today for her first OB visit. EDD by second trimester U/S. TSVD x 2 without problems.   She is currently monitored for the following issues for this low-risk pregnancy and has Arthritis; History of migraine headaches; and Supervision of other normal pregnancy, antepartum on their problem list.  Patient reports no complaints.  Contractions: Not present. Vag. Bleeding: None.  Movement: Present. Denies leaking of fluid.   The following portions of the patient's history were reviewed and updated as appropriate: allergies, current medications, past family history, past medical history, past social history, past surgical history and problem list. Problem list updated.  Objective:   Vitals:   04/05/19 1413  BP: 94/61  Pulse: 81  Temp: 98.6 F (37 C)  Weight: 120 lb 3.2 oz (54.5 kg)    Fetal Status:     Movement: Present     General:  Alert, oriented and cooperative. Patient is in no acute distress.  Skin: Skin is warm and dry. No rash noted.   Cardiovascular: Normal heart rate noted  Respiratory: Normal respiratory effort, no problems with respiration noted  Abdomen: Soft, gravid, appropriate for gestational age. Pain/Pressure: Present     Pelvic:  Cervical exam deferred        Extremities: Normal range of motion.  Edema: None  Mental Status: Normal mood and affect. Normal behavior. Normal judgment and thought content.   Urinalysis:      Assessment and Plan:  Pregnancy: G3P2002 at [redacted]w[redacted]d  1. Supervision of other normal pregnancy, antepartum Prenatal labs and care reviewed with pt Pap 2019 normal Desires Implanon Glucola next visit - Culture, OB Urine - Genetic Screening - Obstetric Panel, Including HIV - Cervicovaginal ancillary only( [facility])  Preterm labor symptoms and general obstetric precautions including but not limited to vaginal bleeding, contractions, leaking of fluid and fetal movement were  reviewed in detail with the patient. Please refer to After Visit Summary for other counseling recommendations.  Return in about 4 weeks (around 05/03/2019) for OB visit, face to face for glucola.   [name], MD

## 2019-04-05 NOTE — Patient Instructions (Signed)

## 2019-04-06 LAB — CERVICOVAGINAL ANCILLARY ONLY
Chlamydia: NEGATIVE
Neisseria Gonorrhea: NEGATIVE

## 2019-04-07 LAB — OBSTETRIC PANEL, INCLUDING HIV
Antibody Screen: NEGATIVE
Basophils Absolute: 0 10*3/uL (ref 0.0–0.2)
Basos: 0 %
EOS (ABSOLUTE): 0.2 10*3/uL (ref 0.0–0.4)
Eos: 2 %
HIV Screen 4th Generation wRfx: NONREACTIVE
Hematocrit: 36.1 % (ref 34.0–46.6)
Hemoglobin: 11.9 g/dL (ref 11.1–15.9)
Hepatitis B Surface Ag: NEGATIVE
Immature Grans (Abs): 0.1 10*3/uL (ref 0.0–0.1)
Immature Granulocytes: 1 %
Lymphocytes Absolute: 2.3 10*3/uL (ref 0.7–3.1)
Lymphs: 22 %
MCH: 30.7 pg (ref 26.6–33.0)
MCHC: 33 g/dL (ref 31.5–35.7)
MCV: 93 fL (ref 79–97)
Monocytes Absolute: 0.7 10*3/uL (ref 0.1–0.9)
Monocytes: 7 %
Neutrophils Absolute: 7.1 10*3/uL — ABNORMAL HIGH (ref 1.4–7.0)
Neutrophils: 68 %
Platelets: 294 10*3/uL (ref 150–450)
RBC: 3.88 x10E6/uL (ref 3.77–5.28)
RDW: 13.2 % (ref 11.7–15.4)
RPR Ser Ql: NONREACTIVE
Rh Factor: POSITIVE
Rubella Antibodies, IGG: 1.74 index (ref >0.99)
WBC: 10.5 10*3/uL (ref 3.4–10.8)

## 2019-04-09 LAB — CULTURE, OB URINE

## 2019-04-09 LAB — URINE CULTURE, OB REFLEX

## 2019-04-10 ENCOUNTER — Telehealth (INDEPENDENT_AMBULATORY_CARE_PROVIDER_SITE_OTHER): Payer: Self-pay | Admitting: *Deleted

## 2019-04-10 DIAGNOSIS — O2343 Unspecified infection of urinary tract in pregnancy, third trimester: Secondary | ICD-10-CM

## 2019-04-10 MED ORDER — NITROFURANTOIN MONOHYD MACRO 100 MG PO CAPS: 100.0000 mg | ORAL_CAPSULE | Freq: Two times a day (BID) | ORAL | 0 refills | Status: DC

## 2019-04-10 NOTE — Telephone Encounter (Signed)
Called pt to inform her that she has a UTI and that macrobid was prescribed for her to take bid x7days which was sent to the [facility] on [address]. Pt verbalized understanding.

## 2019-04-10 NOTE — Telephone Encounter (Signed)
-----   Message from [name], MD sent at 04/10/2019  1:03 PM EDT ----- Macrobid  1 po bid x 7 days Thanks Legrand Como

## 2019-04-11 ENCOUNTER — Encounter: Payer: Self-pay | Admitting: *Deleted

## 2019-04-14 ENCOUNTER — Encounter: Payer: Self-pay | Admitting: *Deleted

## 2019-05-01 ENCOUNTER — Other Ambulatory Visit: Payer: Self-pay | Admitting: *Deleted

## 2019-05-01 DIAGNOSIS — Z3480 Encounter for supervision of other normal pregnancy, unspecified trimester: Secondary | ICD-10-CM

## 2019-05-03 ENCOUNTER — Other Ambulatory Visit: Payer: Self-pay

## 2019-05-03 ENCOUNTER — Ambulatory Visit (INDEPENDENT_AMBULATORY_CARE_PROVIDER_SITE_OTHER): Payer: Self-pay | Admitting: Obstetrics and Gynecology

## 2019-05-03 ENCOUNTER — Other Ambulatory Visit: Payer: Medicaid Other

## 2019-05-03 VITALS — BP 97/61 | HR 81 | Temp 98.0°F | Wt 130.0 lb

## 2019-05-03 DIAGNOSIS — O2342 Unspecified infection of urinary tract in pregnancy, second trimester: Secondary | ICD-10-CM

## 2019-05-03 DIAGNOSIS — O2343 Unspecified infection of urinary tract in pregnancy, third trimester: Secondary | ICD-10-CM | POA: Insufficient documentation

## 2019-05-03 DIAGNOSIS — Z3480 Encounter for supervision of other normal pregnancy, unspecified trimester: Secondary | ICD-10-CM

## 2019-05-03 DIAGNOSIS — Z3482 Encounter for supervision of other normal pregnancy, second trimester: Secondary | ICD-10-CM

## 2019-05-03 DIAGNOSIS — Z23 Encounter for immunization: Secondary | ICD-10-CM

## 2019-05-03 DIAGNOSIS — Z3A27 27 weeks gestation of pregnancy: Secondary | ICD-10-CM

## 2019-05-03 NOTE — Progress Notes (Signed)
Prenatal Visit Note Date: 05/03/2019 Clinic: [facility]  Subjective:  Christine Lambert is a 23 y.o. G3P2002 at [redacted]w[redacted]d being seen today for ongoing prenatal care.  She is currently monitored for the following issues for this low-risk pregnancy and has Arthritis; History of migraine headaches; Supervision of other normal pregnancy, antepartum; and Urinary tract infection in mother during third trimester of pregnancy on their problem list.  Patient reports no complaints.   Contractions: Not present. Vag. Bleeding: None.  Movement: Present. Denies leaking of fluid.   The following portions of the patient's history were reviewed and updated as appropriate: allergies, current medications, past family history, past medical history, past social history, past surgical history and problem list. Problem list updated.  Objective:   Vitals:   05/03/19 1051  BP: 97/61  Pulse: 81  Temp: 98 F (36.7 C)  Weight: 130 lb (59 kg)    Fetal Status: Fetal Heart Rate (bpm): 143   Movement: Present     General:  Alert, oriented and cooperative. Patient is in no acute distress.  Skin: Skin is warm and dry. No rash noted.   Cardiovascular: Normal heart rate noted  Respiratory: Normal respiratory effort, no problems with respiration noted  Abdomen: Soft, gravid, appropriate for gestational age. Pain/Pressure: Absent     Pelvic:  Cervical exam deferred        Extremities: Normal range of motion.  Edema: None  Mental Status: Normal mood and affect. Normal behavior. Normal judgment and thought content.   Urinalysis:      Assessment and Plan:  Pregnancy: G3P2002 at [redacted]w[redacted]d  1. Supervision of other normal pregnancy, antepartum Routine care. 28wk labs today. Will try and get bp cuff sent again  2. Urinary tract infection in mother during third trimester of pregnancy Pt didn't know had to take abx. toc 6wks  Preterm labor symptoms and general obstetric precautions including but not  limited to vaginal bleeding, contractions, leaking of fluid and fetal movement were reviewed in detail with the patient. Please refer to After Visit Summary for other counseling recommendations.  No follow-ups on file. 8/13 rob already on file   Aletha Halim, MD

## 2019-05-03 NOTE — Progress Notes (Signed)
Pt was given a [employer] blood pressure cuff. Pt was instructed on how to use the cuff and when to check it at home. Pt verbalized understanding and had no further questions.

## 2019-05-03 NOTE — Patient Instructions (Signed)
Don't forget to go by the pharmacy to pick up the antibiotic for your Uti  Check your blood pressures weekly and let us know if it's above 140/90

## 2019-05-04 LAB — CBC
Hematocrit: 33.6 % — ABNORMAL LOW (ref 34.0–46.6)
Hemoglobin: 11 g/dL — ABNORMAL LOW (ref 11.1–15.9)
MCH: 30.1 pg (ref 26.6–33.0)
MCHC: 32.7 g/dL (ref 31.5–35.7)
MCV: 92 fL (ref 79–97)
Platelets: 262 10*3/uL (ref 150–450)
RBC: 3.66 x10E6/uL — ABNORMAL LOW (ref 3.77–5.28)
RDW: 12.7 % (ref 11.7–15.4)
WBC: 8.7 10*3/uL (ref 3.4–10.8)

## 2019-05-04 LAB — HIV ANTIBODY (ROUTINE TESTING W REFLEX): HIV Screen 4th Generation wRfx: NONREACTIVE

## 2019-05-04 LAB — GLUCOSE TOLERANCE, 2 HOURS W/ 1HR
Glucose, 1 hour: 70 mg/dL (ref 65–179)
Glucose, 2 hour: 49 mg/dL — ABNORMAL LOW (ref 65–152)
Glucose, Fasting: 67 mg/dL (ref 65–91)

## 2019-05-04 LAB — RPR: RPR Ser Ql: NONREACTIVE

## 2019-05-06 ENCOUNTER — Other Ambulatory Visit: Payer: Self-pay | Admitting: Obstetrics and Gynecology

## 2019-05-06 DIAGNOSIS — O2343 Unspecified infection of urinary tract in pregnancy, third trimester: Secondary | ICD-10-CM

## 2019-05-06 DIAGNOSIS — D649 Anemia, unspecified: Secondary | ICD-10-CM

## 2019-05-06 MED ORDER — DOCUSATE SODIUM 100 MG PO CAPS: 100.0000 mg | ORAL_CAPSULE | Freq: Two times a day (BID) | ORAL | 2 refills | Status: DC | PRN

## 2019-05-06 MED ORDER — POLYSACCHARIDE IRON COMPLEX 150 MG PO CAPS: 150.0000 mg | ORAL_CAPSULE | Freq: Every day | ORAL | 1 refills | Status: DC

## 2019-05-11 IMAGING — US US MFM OB COMP +14 WKS
1 series · 14 of 28 positions shown · non-contrast
Comparison: none

[Series 1: us mfm ob comp +14 wks · 14 of 130 slices shown]
[im 5/130]
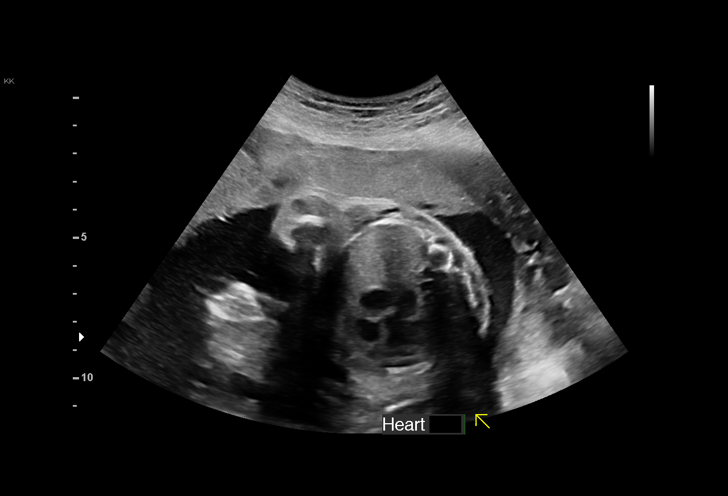
[im 15/130]
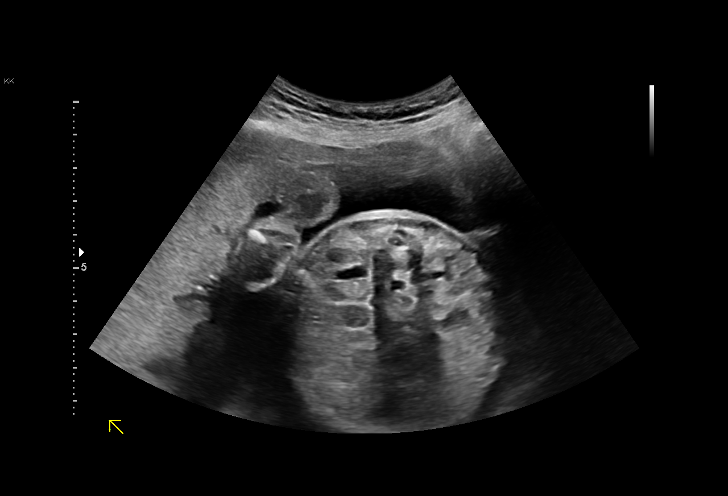
[im 24/130]
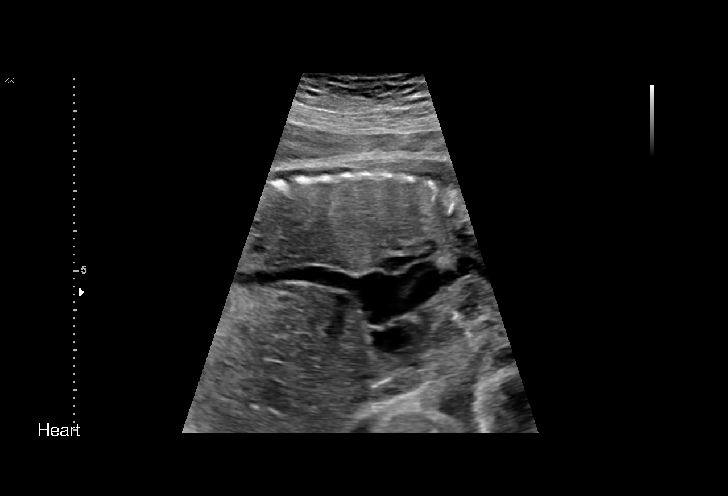
[im 34/130]
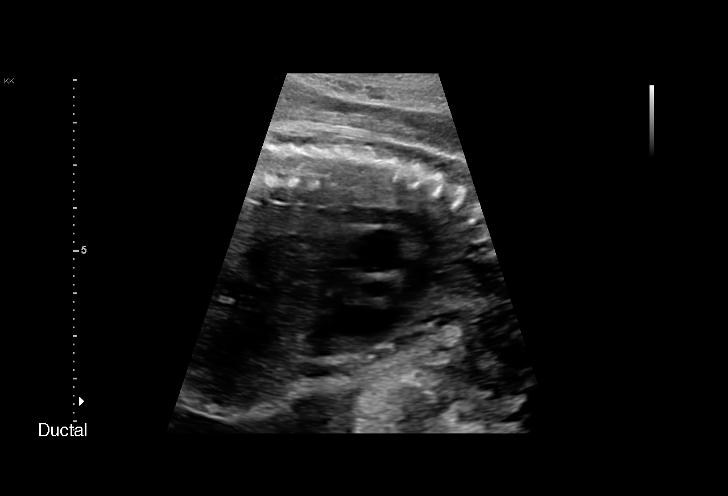
[im 44/130]
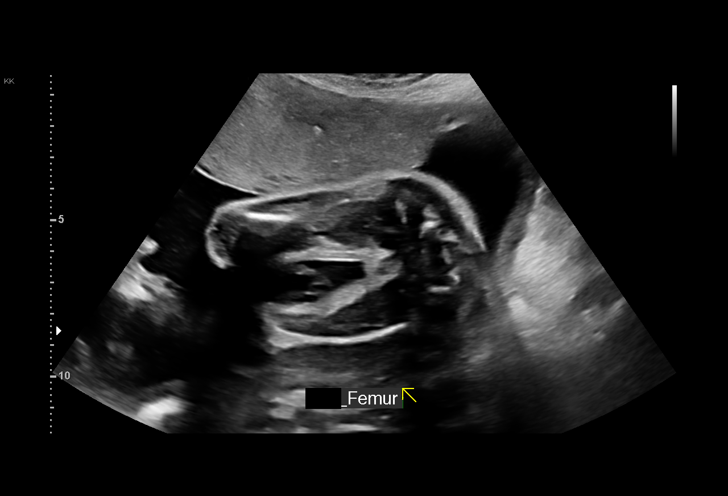
[im 53/130]
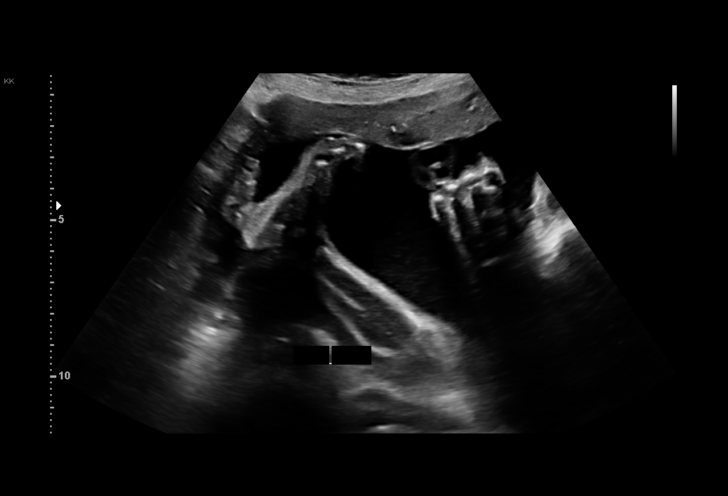
[im 63/130]
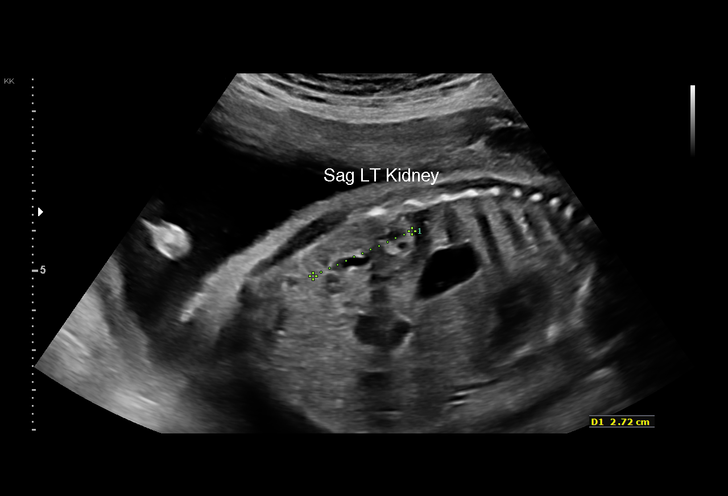
[im 72/130]
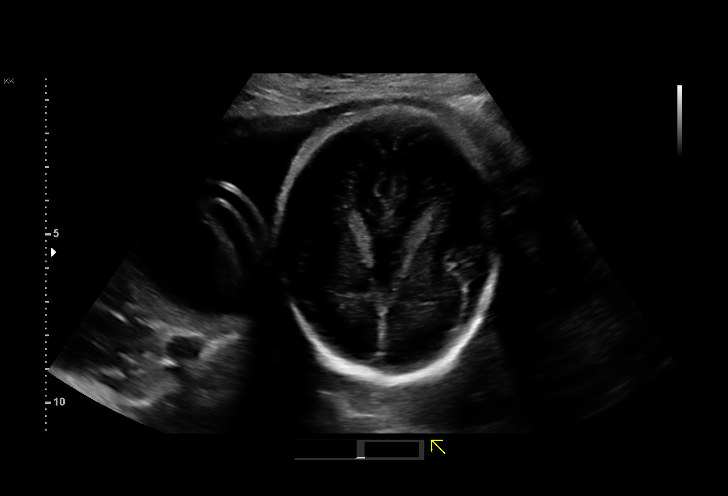
[im 82/130]
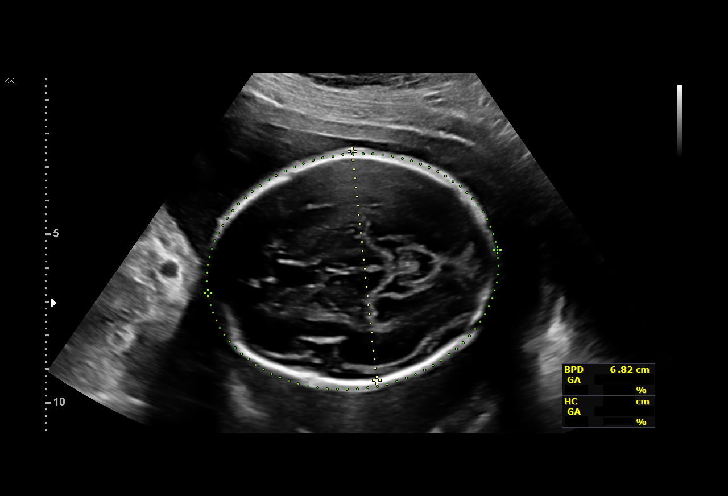
[im 91/130]
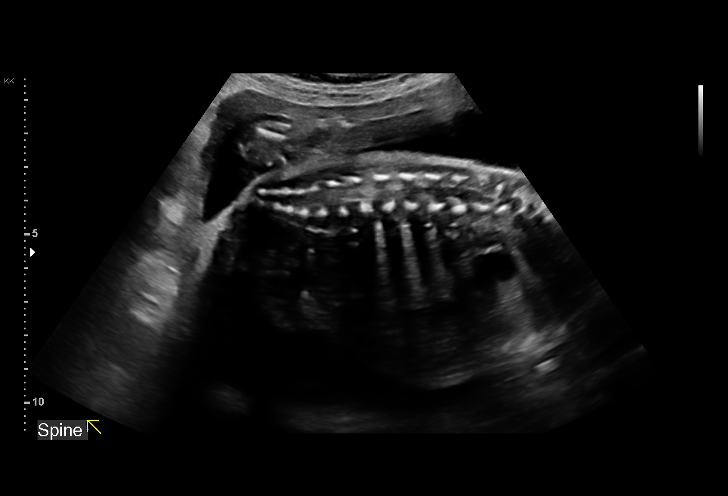
[im 101/130]
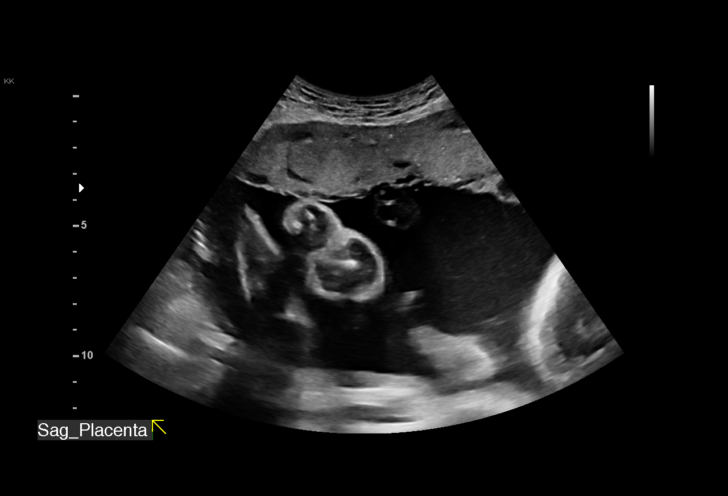
[im 110/130]
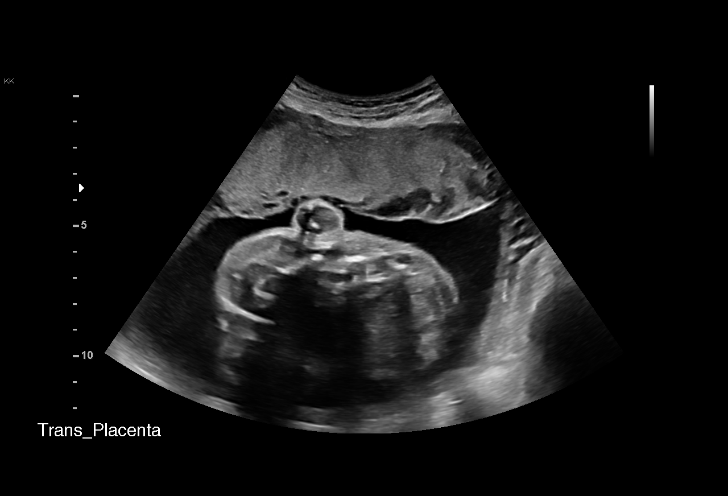
[im 120/130]
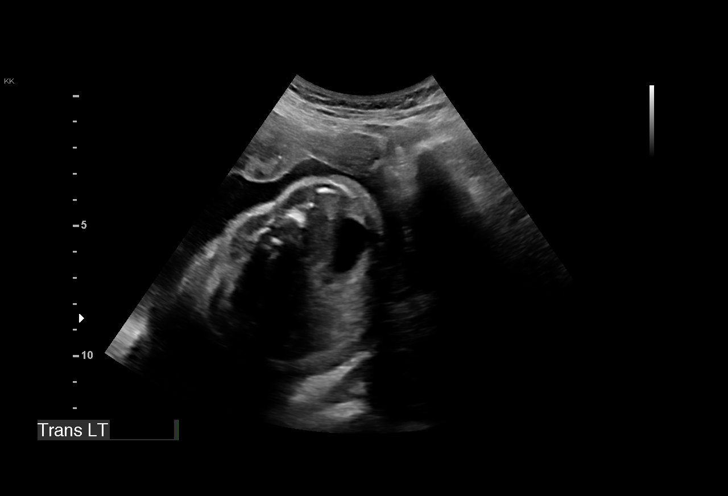
[im 130/130]
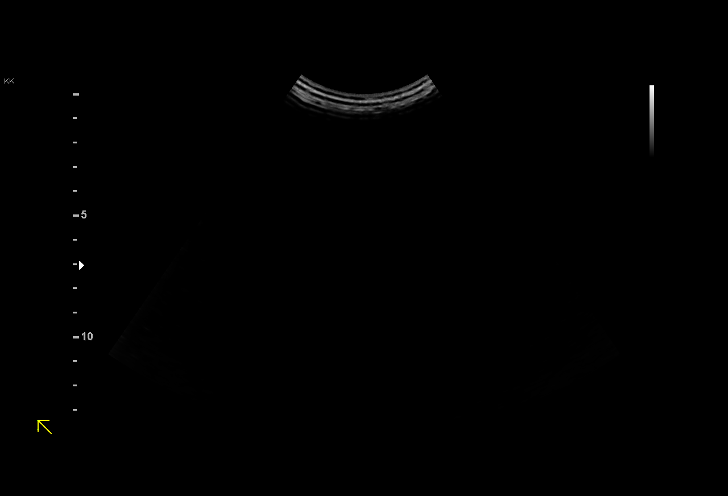

[14 of 28 positions shown; findings below may reference images not displayed]

ALANG NP

1  CHARALAMBIA SAPARILLA           [id number]      [id number]     [id number]
Indications

26 weeks gestation of pregnancy
Encounter for antenatal screening for
malformations
Encounter for uncertain dates
OB History

Blood Type:            Height:  5'1"   Weight (lb):  129       BMI:
Gravidity:    2         Term:   1
Living:       1
Fetal Evaluation

Num Of Fetuses:     1
Fetal Heart         150
Rate(bpm):
Cardiac Activity:   Observed
Presentation:       Cephalic
Placenta:           Anterior, above cervical os
P. Cord Insertion:  Visualized

Amniotic Fluid
AFI FV:      Subjectively within normal limits

Largest Pocket(cm)
6.3
Biometry

BPD:      68.1  mm     G. Age:  27w 3d         63  %    CI:        75.94   %    70 - 86
FL/HC:      20.3   %    18.6 -
HC:      247.7  mm     G. Age:  26w 6d         29  %    HC/AC:      1.05        1.05 -
AC:      234.8  mm     G. Age:  27w 5d         75  %    FL/BPD:     73.7   %    71 - 87
FL:       50.2  mm     G. Age:  27w 0d         44  %    FL/AC:      21.4   %    20 - 24

Est. FW:    6536  gm      2 lb 6 oz     68  %
Gestational Age

LMP:           28w 3d        Date:  08/20/17                 EDD:   05/27/18
U/S Today:     27w 2d                                        EDD:   06/04/18
Best:          26w 5d     Det. By:  Early Ultrasound         EDD:   06/08/18
Anatomy

Cranium:               Appears normal         Aortic Arch:            Appears normal
Cavum:                 Appears normal         Ductal Arch:            Appears normal
Ventricles:            Appears normal         Diaphragm:              Appears normal
Choroid Plexus:        Appears normal         Stomach:                Appears normal, left
sided
Cerebellum:            Appears normal         Abdomen:                Appears normal
Posterior Fossa:       Appears normal         Abdominal Wall:         Appears nml (cord
insert, abd wall)
Nuchal Fold:           Not applicable (>20    Cord Vessels:           Appears normal (3
wks GA)                                        vessel cord)
Face:                  Not well visualized    Kidneys:                Appear normal
Lips:                  Not well visualized    Bladder:                Appears normal
Thoracic:              Appears normal         Spine:                  Appears normal
Heart:                 Not well visualized    Upper Extremities:      Appears normal
RVOT:                  Not well visualized    Lower Extremities:      Appears normal
LVOT:                  Not well visualized

Other:  Female gender. Heels and 5th digit visualized. Technically difficult
due to fetal position.
Cervix Uterus Adnexa

Cervix
Length:            2.9  cm.
Normal appearance by transabdominal scan.

Uterus
No abnormality visualized.

Left Ovary
Within normal limits.

Right Ovary
Within normal limits.

Adnexa:       No abnormality visualized.
Impression

Single living intrauterine pregnancy at 26w 5d.
Placenta Anterior, above cervical os.
Appropriate fetal growth.
Normal amniotic fluid volume.
The fetal anatomic survey is not complete.
No gross fetal anomalies identified.
The cervix measures 2.9cm transabdominally without
funneling.
The adnexa appear normal bilaterally without masses.
Recommendations

Recommend follow-up ultrasound examination in 6 weeks to
complete survey and plot interval growth.

## 2019-05-16 ENCOUNTER — Telehealth: Payer: Self-pay | Admitting: General Practice

## 2019-05-16 DIAGNOSIS — O99013 Anemia complicating pregnancy, third trimester: Secondary | ICD-10-CM

## 2019-05-16 NOTE — Telephone Encounter (Signed)
Opened in error

## 2019-05-17 ENCOUNTER — Telehealth: Payer: Self-pay | Admitting: Obstetrics and Gynecology

## 2019-05-17 NOTE — Telephone Encounter (Signed)
Attempted to call patient about her appointment on 8/13 @ 10:55. No answer, and could not leave a voicemail because the number stated that the call could not be completed at this time

## 2019-05-18 ENCOUNTER — Telehealth: Payer: Self-pay | Admitting: Obstetrics and Gynecology

## 2019-05-18 ENCOUNTER — Encounter: Payer: Self-pay | Admitting: Obstetrics and Gynecology

## 2019-05-18 ENCOUNTER — Other Ambulatory Visit: Payer: Self-pay

## 2019-05-18 NOTE — Telephone Encounter (Signed)
Attempted to call patient to get her rescheduled for her missed OB appointment on 8/13. No answer, left voicemail for patient to give the office a call back to be rescheduled. No show letter mailed.

## 2019-05-18 NOTE — Progress Notes (Signed)
@  1051 am LVM reminder appointment reminder that I will call back in 10 mins. @1101am  no answer. Will try in 4 mins  @1106am  Asked to reschedule appointment.

## 2019-05-18 NOTE — Progress Notes (Signed)
Patient requested to reschedule her visit.   Noni Saupe I, NP 05/18/19 11:08 AM

## 2019-06-08 ENCOUNTER — Encounter: Payer: Self-pay | Admitting: *Deleted

## 2019-06-29 ENCOUNTER — Other Ambulatory Visit: Payer: Self-pay

## 2019-06-29 ENCOUNTER — Telehealth (INDEPENDENT_AMBULATORY_CARE_PROVIDER_SITE_OTHER): Payer: Medicaid Other | Admitting: Obstetrics & Gynecology

## 2019-06-29 ENCOUNTER — Encounter: Payer: Self-pay | Admitting: Obstetrics & Gynecology

## 2019-06-29 DIAGNOSIS — Z3480 Encounter for supervision of other normal pregnancy, unspecified trimester: Secondary | ICD-10-CM

## 2019-06-29 DIAGNOSIS — O2343 Unspecified infection of urinary tract in pregnancy, third trimester: Secondary | ICD-10-CM

## 2019-06-29 DIAGNOSIS — D500 Iron deficiency anemia secondary to blood loss (chronic): Secondary | ICD-10-CM

## 2019-06-29 DIAGNOSIS — Z3A35 35 weeks gestation of pregnancy: Secondary | ICD-10-CM

## 2019-06-29 MED ORDER — BLOOD PRESSURE KIT DEVI: 1.0000 | Freq: Every day | 0 refills | Status: DC

## 2019-06-29 MED ORDER — BLOOD PRESSURE KIT DEVI: 1.0000 | Freq: Every day | 0 refills | Status: AC

## 2019-06-29 NOTE — Progress Notes (Signed)
   [facility] VIRTUAL VIDEO VISIT ENCOUNTER NOTE  Provider location: [facility] at [place]   I connected with Christine Lambert on 06/29/19 at  8:15 AM EDT by MyChart Video Encounter at home and verified that I am speaking with the correct person using two identifiers.   I discussed the limitations, risks, security and privacy concerns of performing an evaluation and management service virtually and the availability of in person appointments. I also discussed with the patient that there may be a patient responsible charge related to this service. The patient expressed understanding and agreed to proceed. Subjective:  Christine Lambert is a 23 y.o. G3P2002 at [redacted]w[redacted]d being seen today for ongoing prenatal care.  She is currently monitored for the following issues for this low-risk pregnancy and has Arthritis; History of migraine headaches; Supervision of other normal pregnancy, antepartum; Urinary tract infection in mother during third trimester of pregnancy; and Anemia on their problem list.  Patient reports no complaints. Occ BH ctx.  Contractions: Not present. Vag. Bleeding: None.  Movement: Present. Denies any leaking of fluid.   The following portions of the patient's history were reviewed and updated as appropriate: allergies, current medications, past family history, past medical history, past social history, past surgical history and problem list.   Objective:  There were no vitals filed for this visit.  Fetal Status:     Movement: Present     General:  Alert, oriented and cooperative. Patient is in no acute distress.  Respiratory: Normal respiratory effort, no problems with respiration noted  Mental Status: Normal mood and affect. Normal behavior. Normal judgment and thought content.  Rest of physical exam deferred due to type of encounter  Imaging: No results found.  Assessment and Plan:  Pregnancy: G3P2002 at [redacted]w[redacted]d 1. Supervision of other  normal pregnancy, antepartum Pt FM BP cuff not working. Pt to bring BP cuff to next visit.   2. Urinary tract infection in mother during third trimester of pregnancy meds not taken. NO UTI sx. Needs Urine cx at next visit.    3. Iron deficiency anemia  Pt did not pick up    Preterm labor symptoms and general obstetric precautions including but not limited to vaginal bleeding, contractions, leaking of fluid and fetal movement were reviewed in detail with the patient. I discussed the assessment and treatment plan with the patient. The patient was provided an opportunity to ask questions and all were answered. The patient agreed with the plan and demonstrated an understanding of the instructions. The patient was advised to call back or seek an in-person office evaluation/go to [facility] at [facility] for any urgent or concerning symptoms. Please refer to After Visit Summary for other counseling recommendations.   I provided 10 minutes of face-to-face time during this encounter.  Return today (on 06/29/2019) for in person.  No future appointments.  Lavonia Drafts, MD [facility], [place]

## 2019-06-29 NOTE — Addendum Note (Signed)
Addended by: Alric Seton on: 06/29/2019 11:43 AM   Modules accepted: Orders

## 2019-07-05 ENCOUNTER — Encounter: Payer: Self-pay | Admitting: Obstetrics and Gynecology

## 2019-07-05 ENCOUNTER — Ambulatory Visit (INDEPENDENT_AMBULATORY_CARE_PROVIDER_SITE_OTHER): Payer: Medicaid Other | Admitting: Obstetrics and Gynecology

## 2019-07-05 ENCOUNTER — Other Ambulatory Visit: Payer: Self-pay

## 2019-07-05 ENCOUNTER — Other Ambulatory Visit (HOSPITAL_COMMUNITY)
Admission: RE | Admit: 2019-07-05 | Discharge: 2019-07-05 | Disposition: A | Discharge status: Home / Self Care | Payer: Medicaid Other | Source: Ambulatory Visit | Attending: Obstetrics and Gynecology | Admitting: Obstetrics and Gynecology

## 2019-07-05 VITALS — BP 102/60 | HR 80 | Temp 98.6°F | Wt 140.1 lb

## 2019-07-05 DIAGNOSIS — Z3480 Encounter for supervision of other normal pregnancy, unspecified trimester: Secondary | ICD-10-CM

## 2019-07-05 DIAGNOSIS — O2343 Unspecified infection of urinary tract in pregnancy, third trimester: Secondary | ICD-10-CM

## 2019-07-05 DIAGNOSIS — Z3A36 36 weeks gestation of pregnancy: Secondary | ICD-10-CM

## 2019-07-05 NOTE — Patient Instructions (Signed)
Third Trimester of Pregnancy The third trimester is from week 28 through week 40 (months 7 through 9). The third trimester is a time when the unborn baby (fetus) is growing rapidly. At the end of the ninth month, the fetus is about 20 inches in length and weighs 6-10 pounds. Body changes during your third trimester Your body will continue to go through many changes during pregnancy. The changes vary from woman to woman. During the third trimester:  Your weight will continue to increase. You can expect to gain 25-35 pounds (11-16 kg) by the end of the pregnancy.  You may begin to get stretch marks on your hips, abdomen, and breasts.  You may urinate more often because the fetus is moving lower into your pelvis and pressing on your bladder.  You may develop or continue to have heartburn. This is caused by increased hormones that slow down muscles in the digestive tract.  You may develop or continue to have constipation because increased hormones slow digestion and cause the muscles that push waste through your intestines to relax.  You may develop hemorrhoids. These are swollen veins (varicose veins) in the rectum that can itch or be painful.  You may develop swollen, bulging veins (varicose veins) in your legs.  You may have increased body aches in the pelvis, back, or thighs. This is due to weight gain and increased hormones that are relaxing your joints.  You may have changes in your hair. These can include thickening of your hair, rapid growth, and changes in texture. Some women also have hair loss during or after pregnancy, or hair that feels dry or thin. Your hair will most likely return to normal after your baby is born.  Your breasts will continue to grow and they will continue to become tender. A yellow fluid (colostrum) may leak from your breasts. This is the first milk you are producing for your baby.  Your belly button may stick out.  You may notice more swelling in your hands,  face, or ankles.  You may have increased tingling or numbness in your hands, arms, and legs. The skin on your belly may also feel numb.  You may feel short of breath because of your expanding uterus.  You may have more problems sleeping. This can be caused by the size of your belly, increased need to urinate, and an increase in your body's metabolism.  You may notice the fetus "dropping," or moving lower in your abdomen (lightening).  You may have increased vaginal discharge.  You may notice your joints feel loose and you may have pain around your pelvic bone. What to expect at prenatal visits You will have prenatal exams every 2 weeks until week 36. Then you will have weekly prenatal exams. During a routine prenatal visit:  You will be weighed to make sure you and the baby are growing normally.  Your blood pressure will be taken.  Your abdomen will be measured to track your baby's growth.  The fetal heartbeat will be listened to.  Any test results from the previous visit will be discussed.  You may have a cervical check near your due date to see if your cervix has softened or thinned (effaced).  You will be tested for Group B streptococcus. This happens between 35 and 37 weeks. Your health care provider may ask you:  What your birth plan is.  How you are feeling.  If you are feeling the baby move.  If you have had any abnormal   symptoms, such as leaking fluid, bleeding, severe headaches, or abdominal cramping.  If you are using any tobacco products, including cigarettes, chewing tobacco, and electronic cigarettes.  If you have any questions. Other tests or screenings that may be performed during your third trimester include:  Blood tests that check for low iron levels (anemia).  Fetal testing to check the health, activity level, and growth of the fetus. Testing is done if you have certain medical conditions or if there are problems during the pregnancy.  Nonstress test  (NST). This test checks the health of your baby to make sure there are no signs of problems, such as the baby not getting enough oxygen. During this test, a belt is placed around your belly. The baby is made to move, and its heart rate is monitored during movement. What is false labor? False labor is a condition in which you feel small, irregular tightenings of the muscles in the womb (contractions) that usually go away with rest, changing position, or drinking water. These are called Braxton Hicks contractions. Contractions may last for hours, days, or even weeks before true labor sets in. If contractions come at regular intervals, become more frequent, increase in intensity, or become painful, you should see your health care provider. What are the signs of labor?  Abdominal cramps.  Regular contractions that start at 10 minutes apart and become stronger and more frequent with time.  Contractions that start on the top of the uterus and spread down to the lower abdomen and back.  Increased pelvic pressure and dull back pain.  A watery or bloody mucus discharge that comes from the vagina.  Leaking of amniotic fluid. This is also known as your "water breaking." It could be a slow trickle or a gush. Let your health care provider know if it has a color or strange odor. If you have any of these signs, call your health care provider right away, even if it is before your due date. Follow these instructions at home: Medicines  Follow your health care provider's instructions regarding medicine use. Specific medicines may be either safe or unsafe to take during pregnancy.  Take a prenatal vitamin that contains at least 600 micrograms (mcg) of folic acid.  If you develop constipation, try taking a stool softener if your health care provider approves. Eating and drinking   Eat a balanced diet that includes fresh fruits and vegetables, whole grains, good sources of protein such as meat, eggs, or tofu,  and low-fat dairy. Your health care provider will help you determine the amount of weight gain that is right for you.  Avoid raw meat and uncooked cheese. These carry germs that can cause birth defects in the baby.  If you have low calcium intake from food, talk to your health care provider about whether you should take a daily calcium supplement.  Eat four or five small meals rather than three large meals a day.  Limit foods that are high in fat and processed sugars, such as fried and sweet foods.  To prevent constipation: ? Drink enough fluid to keep your urine clear or pale yellow. ? Eat foods that are high in fiber, such as fresh fruits and vegetables, whole grains, and beans. Activity  Exercise only as directed by your health care provider. Most women can continue their usual exercise routine during pregnancy. Try to exercise for 30 minutes at least 5 days a week. Stop exercising if you experience uterine contractions.  Avoid heavy lifting.  Do   not exercise in extreme heat or humidity, or at high altitudes.  Wear low-heel, comfortable shoes.  Practice good posture.  You may continue to have sex unless your health care provider tells you otherwise. Relieving pain and discomfort  Take frequent breaks and rest with your legs elevated if you have leg cramps or low back pain.  Take warm sitz baths to soothe any pain or discomfort caused by hemorrhoids. Use hemorrhoid cream if your health care provider approves.  Wear a good support bra to prevent discomfort from breast tenderness.  If you develop varicose veins: ? Wear support pantyhose or compression stockings as told by your healthcare provider. ? Elevate your feet for 15 minutes, 3-4 times a day. Prenatal care  Write down your questions. Take them to your prenatal visits.  Keep all your prenatal visits as told by your health care provider. This is important. Safety  Wear your seat belt at all times when driving.  Make  a list of emergency phone numbers, including numbers for family, friends, the hospital, and police and fire departments. General instructions  Avoid cat litter boxes and soil used by cats. These carry germs that can cause birth defects in the baby. If you have a cat, ask someone to clean the litter box for you.  Do not travel far distances unless it is absolutely necessary and only with the approval of your health care provider.  Do not use hot tubs, steam rooms, or saunas.  Do not drink alcohol.  Do not use any products that contain nicotine or tobacco, such as cigarettes and e-cigarettes. If you need help quitting, ask your health care provider.  Do not use any medicinal herbs or unprescribed drugs. These chemicals affect the formation and growth of the baby.  Do not douche or use tampons or scented sanitary pads.  Do not cross your legs for long periods of time.  To prepare for the arrival of your baby: ? Take prenatal classes to understand, practice, and ask questions about labor and delivery. ? Make a trial run to the hospital. ? Visit the hospital and tour the maternity area. ? Arrange for maternity or paternity leave through employers. ? Arrange for family and friends to take care of pets while you are in the hospital. ? Purchase a rear-facing car seat and make sure you know how to install it in your car. ? Pack your hospital bag. ? Prepare the baby's nursery. Make sure to remove all pillows and stuffed animals from the baby's crib to prevent suffocation.  Visit your dentist if you have not gone during your pregnancy. Use a soft toothbrush to brush your teeth and be gentle when you floss. Contact a health care provider if:  You are unsure if you are in labor or if your water has broken.  You become dizzy.  You have mild pelvic cramps, pelvic pressure, or nagging pain in your abdominal area.  You have lower back pain.  You have persistent nausea, vomiting, or diarrhea.   You have an unusual or bad smelling vaginal discharge.  You have pain when you urinate. Get help right away if:  Your water breaks before 37 weeks.  You have regular contractions less than 5 minutes apart before 37 weeks.  You have a fever.  You are leaking fluid from your vagina.  You have spotting or bleeding from your vagina.  You have severe abdominal pain or cramping.  You have rapid weight loss or weight gain.  You have   shortness of breath with chest pain.  You notice sudden or extreme swelling of your face, hands, ankles, feet, or legs.  Your baby makes fewer than 10 movements in 2 hours.  You have severe headaches that do not go away when you take medicine.  You have vision changes. Summary  The third trimester is from week 28 through week 40, months 7 through 9. The third trimester is a time when the unborn baby (fetus) is growing rapidly.  During the third trimester, your discomfort may increase as you and your baby continue to gain weight. You may have abdominal, leg, and back pain, sleeping problems, and an increased need to urinate.  During the third trimester your breasts will keep growing and they will continue to become tender. A yellow fluid (colostrum) may leak from your breasts. This is the first milk you are producing for your baby.  False labor is a condition in which you feel small, irregular tightenings of the muscles in the womb (contractions) that eventually go away. These are called Braxton Hicks contractions. Contractions may last for hours, days, or even weeks before true labor sets in.  Signs of labor can include: abdominal cramps; regular contractions that start at 10 minutes apart and become stronger and more frequent with time; watery or bloody mucus discharge that comes from the vagina; increased pelvic pressure and dull back pain; and leaking of amniotic fluid. This information is not intended to replace advice given to you by your health  care provider. Make sure you discuss any questions you have with your health care provider. Document Released: 09/15/2001 Document Revised: 01/12/2019 Document Reviewed: 10/27/2016 Elsevier Patient Education  2020 [employer]  

## 2019-07-05 NOTE — Progress Notes (Signed)
Subjective:  Christine Lambert is a 23 y.o. G3P2002 at [redacted]w[redacted]d being seen today for ongoing prenatal care.  She is currently monitored for the following issues for this low-risk pregnancy and has Arthritis; History of migraine headaches; Supervision of other normal pregnancy, antepartum; Urinary tract infection in mother during third trimester of pregnancy; and Anemia on their problem list.  Patient reports general discomforts of pregnancy.  Contractions: Irritability. Vag. Bleeding: None.  Movement: Present. Denies leaking of fluid.   The following portions of the patient's history were reviewed and updated as appropriate: allergies, current medications, past family history, past medical history, past social history, past surgical history and problem list. Problem list updated.  Objective:   Vitals:   07/05/19 1508  BP: 102/60  Pulse: 80  Temp: 98.6 F (37 C)  Weight: 140 lb 1.6 oz (63.5 kg)    Fetal Status: Fetal Heart Rate (bpm): 149   Movement: Present     General:  Alert, oriented and cooperative. Patient is in no acute distress.  Skin: Skin is warm and dry. No rash noted.   Cardiovascular: Normal heart rate noted  Respiratory: Normal respiratory effort, no problems with respiration noted  Abdomen: Soft, gravid, appropriate for gestational age. Pain/Pressure: Absent     Pelvic:  Cervical exam performed        Extremities: Normal range of motion.  Edema: None  Mental Status: Normal mood and affect. Normal behavior. Normal judgment and thought content.   Urinalysis:      Assessment and Plan:  Pregnancy: G3P2002 at [redacted]w[redacted]d  1. Supervision of other normal pregnancy, antepartum Stable Labor precautions - GC/Chlamydia probe amp ([place])not at [facility] - Culture, beta strep (group b only)  2. Urinary tract infection in mother during third trimester of pregnancy  - Culture, OB Urine  Preterm labor symptoms and general obstetric precautions including but not limited to vaginal  bleeding, contractions, leaking of fluid and fetal movement were reviewed in detail with the patient. Please refer to After Visit Summary for other counseling recommendations.  Return in about 1 week (around 07/12/2019) for OB visit, virtual.   [name], MD

## 2019-07-06 LAB — GC/CHLAMYDIA PROBE AMP (~~LOC~~) NOT AT ARMC
Chlamydia: NEGATIVE
Molecular Disclaimer: NEGATIVE
Molecular Disclaimer: NORMAL
Neisseria Gonorrhea: NEGATIVE

## 2019-07-09 LAB — CULTURE, BETA STREP (GROUP B ONLY): Strep Gp B Culture: NEGATIVE

## 2019-07-12 LAB — CULTURE, OB URINE

## 2019-07-12 LAB — URINE CULTURE, OB REFLEX

## 2019-07-13 ENCOUNTER — Telehealth (INDEPENDENT_AMBULATORY_CARE_PROVIDER_SITE_OTHER): Payer: Medicaid Other

## 2019-07-13 DIAGNOSIS — O2343 Unspecified infection of urinary tract in pregnancy, third trimester: Secondary | ICD-10-CM

## 2019-07-13 MED ORDER — SULFAMETHOXAZOLE-TRIMETHOPRIM 800-160 MG PO TABS: 1.0000 | ORAL_TABLET | Freq: Two times a day (BID) | ORAL | 0 refills | Status: AC

## 2019-07-13 NOTE — Telephone Encounter (Signed)
-----   Message from [name], MD sent at 07/12/2019  3:56 PM EDT ----- Christine Lambert DS  1 po bid x 7 days for UTI  Thanks Legrand Como

## 2019-07-13 NOTE — Telephone Encounter (Signed)
Called pt to give urine culture results. Notified pt we will send prescription for Septra DS to her preferred pharmacy. Take as directed. Pt verbalized understanding.  Rx sent to pharmacy per Dr. Marjory Lies message on 07/12/19.

## 2019-07-14 ENCOUNTER — Telehealth (INDEPENDENT_AMBULATORY_CARE_PROVIDER_SITE_OTHER): Payer: Medicaid Other | Admitting: Obstetrics & Gynecology

## 2019-07-14 ENCOUNTER — Other Ambulatory Visit: Payer: Self-pay

## 2019-07-14 ENCOUNTER — Encounter: Payer: Self-pay | Admitting: Obstetrics & Gynecology

## 2019-07-14 DIAGNOSIS — Z3483 Encounter for supervision of other normal pregnancy, third trimester: Secondary | ICD-10-CM

## 2019-07-14 DIAGNOSIS — Z3A37 37 weeks gestation of pregnancy: Secondary | ICD-10-CM

## 2019-07-14 DIAGNOSIS — Z3480 Encounter for supervision of other normal pregnancy, unspecified trimester: Secondary | ICD-10-CM

## 2019-07-14 NOTE — Progress Notes (Signed)
   [facility] VIRTUAL VIDEO VISIT ENCOUNTER NOTE  Provider location: [facility] [place]   I connected with [name] on 07/14/19 at 11:15 AM EDT by MyChart Video Encounter at home and verified that I am speaking with the correct person using two identifiers.   I discussed the limitations, risks, security and privacy concerns of performing an evaluation and management service virtually and the availability of in person appointments. I also discussed with the patient that there may be a patient responsible charge related to this service. The patient expressed understanding and agreed to proceed. Subjective:  Christine Lambert is a 23 y.o. G3P2002 at [redacted]w[redacted]d being seen today for ongoing prenatal care.  She is currently monitored for the following issues for this low-risk pregnancy and has Arthritis; History of migraine headaches; Supervision of other normal pregnancy, antepartum; Urinary tract infection in mother during third trimester of pregnancy; and Anemia on their problem list.  Patient reports no complaints.  Contractions: Not present. Vag. Bleeding: None.  Movement: Present. Denies any leaking of fluid.   The following portions of the patient's history were reviewed and updated as appropriate: allergies, current medications, past family history, past medical history, past social history, past surgical history and problem list.   Objective:  There were no vitals filed for this visit.  Fetal Status:     Movement: Present     General:  Alert, oriented and cooperative. Patient is in no acute distress.  Respiratory: Normal respiratory effort, no problems with respiration noted  Mental Status: Normal mood and affect. Normal behavior. Normal judgment and thought content.  Rest of physical exam deferred due to type of encounter  Imaging: No results found.  Assessment and Plan:  Pregnancy: G3P2002 at [redacted]w[redacted]d 1. Supervision of other normal  pregnancy, antepartum No concerns. Reviewed negative pelvic culture results. On antibiotics for UTI.  Term labor symptoms and general obstetric precautions including but not limited to vaginal bleeding, contractions, leaking of fluid and fetal movement were reviewed in detail with the patient. I discussed the assessment and treatment plan with the patient. The patient was provided an opportunity to ask questions and all were answered. The patient agreed with the plan and demonstrated an understanding of the instructions. The patient was advised to call back or seek an in-person office evaluation/go to MAU at [facility] for any urgent or concerning symptoms. Please refer to After Visit Summary for other counseling recommendations.   I provided 10 minutes of face-to-face time during this encounter.  Return in about 1 week (around 07/21/2019) for OFFICE OB Visit, Flu vaccine.  No future appointments.  Verita Schneiders, MD [facility], [place]

## 2019-07-14 NOTE — Progress Notes (Signed)
Pt has to get batteries for BP Cuff, asked if she could record BP in BRx after she finds batteries. Pt verbalized understanding.

## 2019-07-14 NOTE — Patient Instructions (Signed)
Return to office for any scheduled appointments. Call the office or go to the MAU at [facility] [place] if:  You begin to have strong, frequent contractions  Your water breaks.  Sometimes it is a big gush of fluid, sometimes it is just a trickle that keeps getting your panties wet or running down your legs  You have vaginal bleeding.  It is normal to have a small amount of spotting if your cervix was checked.   You do not feel your baby moving like normal.  If you do not, get something to eat and drink and lay down and focus on feeling your baby move.   If your baby is still not moving like normal, you should call the office or go to MAU.  Any other obstetric concerns.   

## 2019-07-14 NOTE — Progress Notes (Signed)
I connected with  Christine Lambert on 07/14/19 at 11:15 AM EDT by telephone and verified that I am speaking with the correct person using two identifiers.   I discussed the limitations, risks, security and privacy concerns of performing an evaluation and management service by telephone and the availability of in person appointments. I also discussed with the patient that there may be a patient responsible charge related to this service. The patient expressed understanding and agreed to proceed.  Bethanne Ginger, [place] 07/14/2019  11:29 AM

## 2019-07-28 ENCOUNTER — Other Ambulatory Visit: Payer: Self-pay | Admitting: Advanced Practice Midwife

## 2019-07-28 ENCOUNTER — Telehealth (HOSPITAL_COMMUNITY): Payer: Self-pay | Admitting: *Deleted

## 2019-07-28 ENCOUNTER — Ambulatory Visit (INDEPENDENT_AMBULATORY_CARE_PROVIDER_SITE_OTHER): Payer: Medicaid Other | Admitting: Obstetrics and Gynecology

## 2019-07-28 ENCOUNTER — Other Ambulatory Visit: Payer: Self-pay

## 2019-07-28 VITALS — BP 104/67 | HR 74 | Temp 98.3°F | Wt 145.3 lb

## 2019-07-28 DIAGNOSIS — O2343 Unspecified infection of urinary tract in pregnancy, third trimester: Secondary | ICD-10-CM

## 2019-07-28 DIAGNOSIS — Z3A39 39 weeks gestation of pregnancy: Secondary | ICD-10-CM

## 2019-07-28 DIAGNOSIS — Z3480 Encounter for supervision of other normal pregnancy, unspecified trimester: Secondary | ICD-10-CM

## 2019-07-28 NOTE — Telephone Encounter (Signed)
Preadmission screen  

## 2019-07-28 NOTE — Progress Notes (Signed)
Prenatal Visit Note Date: 07/28/2019 Clinic: [facility]  Subjective:  Christine Lambert is a 23 y.o. G3P2002 at [redacted]w[redacted]d being seen today for ongoing prenatal care.  She is currently monitored for the following issues for this low-risk pregnancy and has Arthritis; History of migraine headaches; Supervision of other normal pregnancy, antepartum; Urinary tract infection in mother during third trimester of pregnancy; and Anemia on their problem list.  Patient reports no complaints.   Contractions: Not present. Vag. Bleeding: None.  Movement: Present. Denies leaking of fluid.   The following portions of the patient's history were reviewed and updated as appropriate: allergies, current medications, past family history, past medical history, past social history, past surgical history and problem list. Problem list updated.  Objective:   Vitals:   07/28/19 0930  BP: 104/67  Pulse: 74  Temp: 98.3 F (36.8 C)  Weight: 145 lb 4.8 oz (65.9 kg)    Fetal Status: Fetal Heart Rate (bpm): 152 Fundal Height: 38 cm Movement: Present  Presentation: Vertex  General:  Alert, oriented and cooperative. Patient is in no acute distress.  Skin: Skin is warm and dry. No rash noted.   Cardiovascular: Normal heart rate noted  Respiratory: Normal respiratory effort, no problems with respiration noted  Abdomen: Soft, gravid, appropriate for gestational age. Pain/Pressure: Absent     Pelvic:  Cervical exam performed Dilation: 3 Effacement (%): 70 Station: -2  Extremities: Normal range of motion.  Edema: None  Mental Status: Normal mood and affect. Normal behavior. Normal judgment and thought content.   Urinalysis:      Assessment and Plan:  Pregnancy: G3P2002 at [redacted]w[redacted]d  1. Supervision of other normal pregnancy, antepartum Membranes stripped. Will set up IOL  2. Urinary tract infection in mother during third trimester of pregnancy toc nv  Term labor symptoms and general obstetric  precautions including but not limited to vaginal bleeding, contractions, leaking of fluid and fetal movement were reviewed in detail with the patient. Please refer to After Visit Summary for other counseling recommendations.  Return in about 1 week (around 08/04/2019) for in person, nst/bpp with diane.   Aletha Halim, MD

## 2019-07-30 ENCOUNTER — Other Ambulatory Visit: Payer: Self-pay

## 2019-07-30 ENCOUNTER — Encounter (HOSPITAL_COMMUNITY): Payer: Self-pay

## 2019-07-30 ENCOUNTER — Inpatient Hospital Stay (EMERGENCY_DEPARTMENT_HOSPITAL)
Admission: AD | Admit: 2019-07-30 | Discharge: 2019-07-30 | Disposition: A | Discharge status: Home / Self Care | Payer: Medicaid Other | Source: Home / Self Care | Attending: Obstetrics and Gynecology | Admitting: Obstetrics and Gynecology

## 2019-07-30 DIAGNOSIS — Z0371 Encounter for suspected problem with amniotic cavity and membrane ruled out: Secondary | ICD-10-CM

## 2019-07-30 DIAGNOSIS — Z3A39 39 weeks gestation of pregnancy: Secondary | ICD-10-CM | POA: Insufficient documentation

## 2019-07-30 LAB — POCT FERN TEST
POCT Fern Test: NEGATIVE
POCT Fern Test: NEGATIVE

## 2019-07-30 NOTE — MAU Provider Note (Signed)
First Provider Initiated Contact with Patient 07/30/19 1941       S: Ms. Christine Lambert is a 23 y.o. G3P2002 at [redacted]w[redacted]d  who presents to MAU today complaining of leaking of fluid since earlier today. She denies vaginal bleeding. She denies contractions. She reports decreased fetal movement today but reports normal movement while in MAU.   O: BP 105/62 (BP Location: Right Arm)   Pulse 89   Temp 98.6 F (37 C) (Oral)   Resp 16   Ht 5\' 1"  (1.549 m)   Wt 67.2 kg   LMP 12/25/2018 (Approximate)   SpO2 99%   BMI 28.00 kg/m  GENERAL: Well-developed, well-nourished female in no acute distress.  HEAD: Normocephalic, atraumatic.  CHEST: Normal effort of breathing, regular heart rate ABDOMEN: Soft, nontender, gravid PELVIC: Normal external female genitalia. Vagina is pink and rugated. Cervix with normal contour, no lesions. Normal discharge.  No pooling.   Cervical exam:  Dilation: 4 Effacement (%): 70 Cervical Position: Posterior Station: -3 Presentation: Vertex Exam by:: Gilmer Mor RN   Fetal Monitoring: Baseline: 145 Variability: moderate Accelerations: 15x15 Decelerations: none Contractions: irregular ctx.  Results for orders placed or performed during the hospital encounter of 07/30/19 (from the past 24 hour(s))  POCT fern test     Status: None   Collection Time: 07/30/19  7:35 PM  Result Value Ref Range   POCT Fern Test Negative = intact amniotic membranes   POCT fern test     Status: None   Collection Time: 07/30/19  7:53 PM  Result Value Ref Range   POCT Fern Test Negative = intact amniotic membranes      A: SIUP at [redacted]w[redacted]d  Membranes intact  P: Discharge home  Discussed reasons to return to MAU Keep scheduled induction  Jorje Guild, NP 07/30/2019 7:55 PM

## 2019-07-30 NOTE — Discharge Instructions (Signed)
Fetal Movement Counts Patient Name: ________________________________________________ Patient Due Date: ____________________ What is a fetal movement count?  A fetal movement count is the number of times that you feel your baby move during a certain amount of time. This may also be called a fetal kick count. A fetal movement count is recommended for every pregnant woman. You may be asked to start counting fetal movements as early as week 28 of your pregnancy. Pay attention to when your baby is most active. You may notice your baby's sleep and wake cycles. You may also notice things that make your baby move more. You should do a fetal movement count:  When your baby is normally most active.  At the same time each day. A good time to count movements is while you are resting, after having something to eat and drink. How do I count fetal movements? 1. Find a quiet, comfortable area. Sit, or lie down on your side. 2. Write down the date, the start time and stop time, and the number of movements that you felt between those two times. Take this information with you to your health care visits. 3. For 2 hours, count kicks, flutters, swishes, rolls, and jabs. You should feel at least 10 movements during 2 hours. 4. You may stop counting after you have felt 10 movements. 5. If you do not feel 10 movements in 2 hours, have something to eat and drink. Then, keep resting and counting for 1 hour. If you feel at least 4 movements during that hour, you may stop counting. Contact a health care provider if:  You feel fewer than 4 movements in 2 hours.  Your baby is not moving like he or she usually does. Date: ____________ Start time: ____________ Stop time: ____________ Movements: ____________ Date: ____________ Start time: ____________ Stop time: ____________ Movements: ____________ Date: ____________ Start time: ____________ Stop time: ____________ Movements: ____________ Date: ____________ Start time:  ____________ Stop time: ____________ Movements: ____________ Date: ____________ Start time: ____________ Stop time: ____________ Movements: ____________ Date: ____________ Start time: ____________ Stop time: ____________ Movements: ____________ Date: ____________ Start time: ____________ Stop time: ____________ Movements: ____________ Date: ____________ Start time: ____________ Stop time: ____________ Movements: ____________ Date: ____________ Start time: ____________ Stop time: ____________ Movements: ____________ This information is not intended to replace advice given to you by your health care provider. Make sure you discuss any questions you have with your health care provider. Document Released: 10/21/2006 Document Revised: 10/11/2018 Document Reviewed: 10/31/2015 Elsevier Patient Education  2020 [employer] Signs and Symptoms of Labor Labor is your body's natural process of moving your baby, placenta, and umbilical cord out of your uterus. The process of labor usually starts when your baby is full-term, between 37 and 40 weeks of pregnancy. How will I know when I am close to going into labor? As your body prepares for labor and the birth of your baby, you may notice the following symptoms in the weeks and days before true labor starts:  Having a strong desire to get your home ready to receive your new baby. This is called nesting. Nesting may be a sign that labor is approaching, and it may occur several weeks before birth. Nesting may involve cleaning and organizing your home.  Passing a small amount of thick, bloody mucus out of your vagina (normal bloody show or losing your mucus plug). This may happen more than a week before labor begins, or it might occur right before labor begins as the opening of the cervix starts   to widen (dilate). For some women, the entire mucus plug passes at once. For others, smaller portions of the mucus plug may gradually pass over several days.  Your baby  moving (dropping) lower in your pelvis to get into position for birth (lightening). When this happens, you may feel more pressure on your bladder and pelvic bone and less pressure on your ribs. This may make it easier to breathe. It may also cause you to need to urinate more often and have problems with bowel movements.  Having "practice contractions" (Braxton Hicks contractions) that occur at irregular (unevenly spaced) intervals that are more than 10 minutes apart. This is also called false labor. False labor contractions are common after exercise or sexual activity, and they will stop if you change position, rest, or drink fluids. These contractions are usually mild and do not get stronger over time. They may feel like: ? A backache or back pain. ? Mild cramps, similar to menstrual cramps. ? Tightening or pressure in your abdomen. Other early symptoms that labor may be starting soon include:  Nausea or loss of appetite.  Diarrhea.  Having a sudden burst of energy, or feeling very tired.  Mood changes.  Having trouble sleeping. How will I know when labor has begun? Signs that true labor has begun may include:  Having contractions that come at regular (evenly spaced) intervals and increase in intensity. This may feel like more intense tightening or pressure in your abdomen that moves to your back. ? Contractions may also feel like rhythmic pain in your upper thighs or back that comes and goes at regular intervals. ? For first-time mothers, this change in intensity of contractions often occurs at a more gradual pace. ? Women who have given birth before may notice a more rapid progression of contraction changes.  Having a feeling of pressure in the vaginal area.  Your water breaking (rupture of membranes). This is when the sac of fluid that surrounds your baby breaks. When this happens, you will notice fluid leaking from your vagina. This may be clear or blood-tinged. Labor usually starts  within 24 hours of your water breaking, but it may take longer to begin. ? Some women notice this as a gush of fluid. ? Others notice that their underwear repeatedly becomes damp. Follow these instructions at home:   When labor starts, or if your water breaks, call your health care provider or nurse care line. Based on your situation, they will determine when you should go in for an exam.  When you are in early labor, you may be able to rest and manage symptoms at home. Some strategies to try at home include: ? Breathing and relaxation techniques. ? Taking a warm bath or shower. ? Listening to music. ? Using a heating pad on the lower back for pain. If you are directed to use heat:  Place a towel between your skin and the heat source.  Leave the heat on for 20-30 minutes.  Remove the heat if your skin turns bright red. This is especially important if you are unable to feel pain, heat, or cold. You may have a greater risk of getting burned. Get help right away if:  You have painful, regular contractions that are 5 minutes apart or less.  Labor starts before you are [redacted] weeks along in your pregnancy.  You have a fever.  You have a headache that does not go away.  You have bright red blood coming from your vagina.  You   do not feel your baby moving.  You have a sudden onset of: ? Severe headache with vision problems. ? Nausea, vomiting, or diarrhea. ? Chest pain or shortness of breath. These symptoms may be an emergency. If your health care provider recommends that you go to the hospital or birth center where you plan to deliver, do not drive yourself. Have someone else drive you, or call emergency services (911 in the [place] Summary  Labor is your body's natural process of moving your baby, placenta, and umbilical cord out of your uterus.  The process of labor usually starts when your baby is full-term, between 37 and 40 weeks of pregnancy.  When labor starts, or if your water  breaks, call your health care provider or nurse care line. Based on your situation, they will determine when you should go in for an exam. This information is not intended to replace advice given to you by your health care provider. Make sure you discuss any questions you have with your health care provider. Document Released: 02/26/2017 Document Revised: 06/21/2017 Document Reviewed: 02/26/2017 Elsevier Patient Education  2020 Elsevier Inc.  

## 2019-07-30 NOTE — MAU Note (Signed)
Christine Lambert is a 23 y.o. at [redacted]w[redacted]d here in MAU reporting: LOF that started today. States the fluid is "bubbling" out. The fluids has been clear. No recent IC. Having some mild cramping. No bleeding. Has not felt any fetal movement today.  Onset of complaint: today  Pain score: 2/10  Vitals:   07/30/19 1858  BP: 102/71  Pulse: 93  Resp: 16  Temp: 98.6 F (37 C)  SpO2: 99%     FHT: 161, fetal movement palpated  Lab orders placed from triage: none

## 2019-07-31 ENCOUNTER — Inpatient Hospital Stay (HOSPITAL_COMMUNITY)
Admission: AD | Admit: 2019-07-31 | Discharge: 2019-08-01 | DRG: 807 | Disposition: A | Discharge status: Home / Self Care | Payer: Medicaid Other | Attending: Obstetrics and Gynecology | Admitting: Obstetrics and Gynecology

## 2019-07-31 ENCOUNTER — Other Ambulatory Visit (HOSPITAL_COMMUNITY)
Admission: RE | Admit: 2019-07-31 | Discharge: 2019-07-31 | Disposition: A | Discharge status: Home / Self Care | Payer: Medicaid Other | Source: Ambulatory Visit | Attending: Obstetrics and Gynecology | Admitting: Obstetrics and Gynecology

## 2019-07-31 ENCOUNTER — Inpatient Hospital Stay (HOSPITAL_COMMUNITY): Payer: Medicaid Other | Admitting: Anesthesiology

## 2019-07-31 ENCOUNTER — Encounter (HOSPITAL_COMMUNITY): Payer: Self-pay

## 2019-07-31 DIAGNOSIS — Z3A40 40 weeks gestation of pregnancy: Secondary | ICD-10-CM

## 2019-07-31 DIAGNOSIS — O480 Post-term pregnancy: Principal | ICD-10-CM | POA: Diagnosis present

## 2019-07-31 DIAGNOSIS — Z87891 Personal history of nicotine dependence: Secondary | ICD-10-CM | POA: Diagnosis not present

## 2019-07-31 DIAGNOSIS — Z20828 Contact with and (suspected) exposure to other viral communicable diseases: Secondary | ICD-10-CM | POA: Insufficient documentation

## 2019-07-31 DIAGNOSIS — Z23 Encounter for immunization: Secondary | ICD-10-CM

## 2019-07-31 DIAGNOSIS — D649 Anemia, unspecified: Secondary | ICD-10-CM | POA: Diagnosis present

## 2019-07-31 DIAGNOSIS — Z30017 Encounter for initial prescription of implantable subdermal contraceptive: Secondary | ICD-10-CM

## 2019-07-31 DIAGNOSIS — Z01812 Encounter for preprocedural laboratory examination: Secondary | ICD-10-CM | POA: Diagnosis present

## 2019-07-31 DIAGNOSIS — O9902 Anemia complicating childbirth: Secondary | ICD-10-CM | POA: Diagnosis present

## 2019-07-31 DIAGNOSIS — Z87440 Personal history of urinary (tract) infections: Secondary | ICD-10-CM | POA: Diagnosis not present

## 2019-07-31 DIAGNOSIS — Z3480 Encounter for supervision of other normal pregnancy, unspecified trimester: Secondary | ICD-10-CM

## 2019-07-31 LAB — CBC
HCT: 35.5 % — ABNORMAL LOW (ref 36.0–46.0)
Hemoglobin: 11.3 g/dL — ABNORMAL LOW (ref 12.0–15.0)
MCH: 28.4 pg (ref 26.0–34.0)
MCHC: 31.8 g/dL (ref 30.0–36.0)
MCV: 89.2 fL (ref 80.0–100.0)
Platelets: 242 10*3/uL (ref 150–400)
RBC: 3.98 MIL/uL (ref 3.87–5.11)
RDW: 13.4 % (ref 11.5–15.5)
WBC: 9.2 10*3/uL (ref 4.0–10.5)
nRBC: 0 % (ref 0.0–0.2)

## 2019-07-31 LAB — RPR: RPR Ser Ql: NONREACTIVE

## 2019-07-31 LAB — TYPE AND SCREEN
ABO/RH(D): B POS
Antibody Screen: NEGATIVE

## 2019-07-31 LAB — ABO/RH: ABO/RH(D): B POS

## 2019-07-31 LAB — SARS CORONAVIRUS 2 BY RT PCR (HOSPITAL ORDER, PERFORMED IN ~~LOC~~ HOSPITAL LAB): SARS Coronavirus 2: NEGATIVE

## 2019-07-31 MED ORDER — IBUPROFEN 600 MG PO TABS
600.0000 mg | ORAL_TABLET | Freq: Four times a day (QID) | ORAL | Status: DC
  Administered 2019-07-31 – 2019-08-01 (×5): 600 mg via ORAL
  Filled 2019-07-31 (×5): qty 1

## 2019-07-31 MED ORDER — ACETAMINOPHEN 325 MG PO TABS: 650.0000 mg | ORAL_TABLET | ORAL | Status: DC | PRN

## 2019-07-31 MED ORDER — SENNOSIDES-DOCUSATE SODIUM 8.6-50 MG PO TABS
2.0000 | ORAL_TABLET | ORAL | Status: DC
  Filled 2019-07-31: qty 2

## 2019-07-31 MED ORDER — LIDOCAINE HCL (PF) 1 % IJ SOLN
INTRAMUSCULAR | Status: DC | PRN
  Administered 2019-07-31 (×2): 4 mL via EPIDURAL

## 2019-07-31 MED ORDER — BENZOCAINE-MENTHOL 20-0.5 % EX AERO
1.0000 "application " | INHALATION_SPRAY | CUTANEOUS | Status: DC | PRN
  Administered 2019-07-31: 1 via TOPICAL
  Filled 2019-07-31: qty 56

## 2019-07-31 MED ORDER — ACETAMINOPHEN 325 MG PO TABS
650.0000 mg | ORAL_TABLET | ORAL | Status: DC | PRN
  Administered 2019-07-31 – 2019-08-01 (×3): 650 mg via ORAL
  Filled 2019-07-31 (×3): qty 2

## 2019-07-31 MED ORDER — OXYTOCIN 40 UNITS IN NORMAL SALINE INFUSION - SIMPLE MED
2.5000 [IU]/h | INTRAVENOUS | Status: DC
  Administered 2019-07-31: 2.5 [IU]/h via INTRAVENOUS
  Filled 2019-07-31: qty 1000

## 2019-07-31 MED ORDER — WITCH HAZEL-GLYCERIN EX PADS: 1.0000 "application " | MEDICATED_PAD | CUTANEOUS | Status: DC | PRN

## 2019-07-31 MED ORDER — ONDANSETRON HCL 4 MG PO TABS: 4.0000 mg | ORAL_TABLET | ORAL | Status: DC | PRN

## 2019-07-31 MED ORDER — ZOLPIDEM TARTRATE 5 MG PO TABS: 5.0000 mg | ORAL_TABLET | Freq: Every evening | ORAL | Status: DC | PRN

## 2019-07-31 MED ORDER — INFLUENZA VAC SPLIT QUAD 0.5 ML IM SUSY
0.5000 mL | PREFILLED_SYRINGE | INTRAMUSCULAR | Status: AC
  Administered 2019-08-01: 0.5 mL via INTRAMUSCULAR
  Filled 2019-07-31: qty 0.5

## 2019-07-31 MED ORDER — EPHEDRINE 5 MG/ML INJ: 10.0000 mg | INTRAVENOUS | Status: DC | PRN

## 2019-07-31 MED ORDER — DIPHENHYDRAMINE HCL 25 MG PO CAPS: 25.0000 mg | ORAL_CAPSULE | Freq: Four times a day (QID) | ORAL | Status: DC | PRN

## 2019-07-31 MED ORDER — DIBUCAINE (PERIANAL) 1 % EX OINT: 1.0000 "application " | TOPICAL_OINTMENT | CUTANEOUS | Status: DC | PRN

## 2019-07-31 MED ORDER — SODIUM CHLORIDE (PF) 0.9 % IJ SOLN
INTRAMUSCULAR | Status: DC | PRN
  Administered 2019-07-31: 12 mL/h via EPIDURAL

## 2019-07-31 MED ORDER — LIDOCAINE HCL (PF) 1 % IJ SOLN: 30.0000 mL | INTRAMUSCULAR | Status: DC | PRN

## 2019-07-31 MED ORDER — OXYTOCIN BOLUS FROM INFUSION
500.0000 mL | Freq: Once | INTRAVENOUS | Status: AC
  Administered 2019-07-31: 500 mL via INTRAVENOUS

## 2019-07-31 MED ORDER — TERBUTALINE SULFATE 1 MG/ML IJ SOLN: 0.2500 mg | Freq: Once | INTRAMUSCULAR | Status: DC | PRN

## 2019-07-31 MED ORDER — SOD CITRATE-CITRIC ACID 500-334 MG/5ML PO SOLN: 30.0000 mL | ORAL | Status: DC | PRN

## 2019-07-31 MED ORDER — TETANUS-DIPHTH-ACELL PERTUSSIS 5-2.5-18.5 LF-MCG/0.5 IM SUSP: 0.5000 mL | Freq: Once | INTRAMUSCULAR | Status: DC

## 2019-07-31 MED ORDER — PHENYLEPHRINE 40 MCG/ML (10ML) SYRINGE FOR IV PUSH (FOR BLOOD PRESSURE SUPPORT): 80.0000 ug | PREFILLED_SYRINGE | INTRAVENOUS | Status: DC | PRN

## 2019-07-31 MED ORDER — ONDANSETRON HCL 4 MG/2ML IJ SOLN: 4.0000 mg | Freq: Four times a day (QID) | INTRAMUSCULAR | Status: DC | PRN

## 2019-07-31 MED ORDER — ONDANSETRON HCL 4 MG/2ML IJ SOLN: 4.0000 mg | INTRAMUSCULAR | Status: DC | PRN

## 2019-07-31 MED ORDER — DIPHENHYDRAMINE HCL 50 MG/ML IJ SOLN: 12.5000 mg | INTRAMUSCULAR | Status: DC | PRN

## 2019-07-31 MED ORDER — LACTATED RINGERS IV SOLN: 500.0000 mL | Freq: Once | INTRAVENOUS | Status: DC

## 2019-07-31 MED ORDER — FENTANYL CITRATE (PF) 100 MCG/2ML IJ SOLN
50.0000 ug | INTRAMUSCULAR | Status: DC | PRN
  Administered 2019-07-31: 50 ug via INTRAVENOUS
  Filled 2019-07-31: qty 2

## 2019-07-31 MED ORDER — MISOPROSTOL 25 MCG QUARTER TABLET: 25.0000 ug | ORAL_TABLET | ORAL | Status: DC | PRN

## 2019-07-31 MED ORDER — LACTATED RINGERS IV SOLN
INTRAVENOUS | Status: DC
  Administered 2019-07-31: 09:00:00 via INTRAVENOUS

## 2019-07-31 MED ORDER — COCONUT OIL OIL: 1.0000 "application " | TOPICAL_OIL | Status: DC | PRN

## 2019-07-31 MED ORDER — PRENATAL MULTIVITAMIN CH
1.0000 | ORAL_TABLET | Freq: Every day | ORAL | Status: DC
  Administered 2019-08-01: 1 via ORAL
  Filled 2019-07-31: qty 1

## 2019-07-31 MED ORDER — FENTANYL-BUPIVACAINE-NACL 0.5-0.125-0.9 MG/250ML-% EP SOLN
12.0000 mL/h | EPIDURAL | Status: DC | PRN
  Filled 2019-07-31: qty 250

## 2019-07-31 MED ORDER — PHENYLEPHRINE 40 MCG/ML (10ML) SYRINGE FOR IV PUSH (FOR BLOOD PRESSURE SUPPORT)
80.0000 ug | PREFILLED_SYRINGE | INTRAVENOUS | Status: DC | PRN
  Filled 2019-07-31: qty 10

## 2019-07-31 MED ORDER — LACTATED RINGERS IV SOLN: 500.0000 mL | INTRAVENOUS | Status: DC | PRN

## 2019-07-31 MED ORDER — SIMETHICONE 80 MG PO CHEW: 80.0000 mg | CHEWABLE_TABLET | ORAL | Status: DC | PRN

## 2019-07-31 NOTE — MAU Note (Signed)
Pt presents to MAU with c/o ctx that started last night. She was seen in MAU and was 4 cm. She is reporting bloody show but denies LOF. +FM

## 2019-07-31 NOTE — Anesthesia Postprocedure Evaluation (Signed)
Anesthesia Post Note  Patient: Kaiyla Stahly  Procedure(s) Performed: AN AD [place]     Patient location during evaluation: Mother Baby Anesthesia Type: Epidural Level of consciousness: awake, awake and alert and oriented Pain management: pain level controlled Vital Signs Assessment: post-procedure vital signs reviewed and stable Respiratory status: spontaneous breathing Cardiovascular status: blood pressure returned to baseline Postop Assessment: no headache, no backache, patient able to bend at knees, able to ambulate, adequate PO intake and no apparent nausea or vomiting Anesthetic complications: no    Last Vitals:  Vitals:   07/31/19 1301 07/31/19 1430  BP: 97/68 101/61  Pulse: 73 (!) 57  Resp: 20 18  Temp:  36.7 C  SpO2:      Last Pain:  Vitals:   07/31/19 1530  TempSrc:   PainSc: 2    Pain Goal: Patients Stated Pain Goal: 3 (07/31/19 1530)                 Kathie Rhodes

## 2019-07-31 NOTE — Lactation Note (Signed)
This note was copied from a baby's chart. Lactation Consultation Note  Patient Name: Christine Lambert JGGEZ'M Date: 07/31/2019 Reason for consult: Term  P3 mother whose infant is now 64 hours old.  Mother breast fed her first child (now 23 years old) for 4 months and her second child (now 86 year old) for 3 weeks.  Mother desires to pump and bottle feed only with this third child.  DEBP initiated.  Pump parts, assembly, disassembly and cleaning discussed.  #24 flange size is appropriate at this time.  Observed mother pumping for approximately 5 minutes while providing basic education.  Mother will pump every 2-3 hours and feed back any EBM she obtains to baby.  Encouraged hand expression before/after pumping to help milk supply.  Colostrum container provided and milk storage times reviewed.  Finger feeding demonstrated.  Mother does not have a DEBP for home use.  She is a [facility] participant in [employer].  Offered to fax a referral and mother appreciative.  Suggested she call the office in the morning to follow up on obtaining her DEBP.  Father present.  RN updated on mother's feeding preference.   Maternal Data Formula Feeding for Exclusion: No Has patient been taught Hand Expression?: Yes Does the patient have breastfeeding experience prior to this delivery?: Yes  Feeding Feeding Type: Breast Milk  LATCH Score                   Interventions    Lactation Tools Discussed/Used WIC Program: Yes Pump Review: Setup, frequency, and cleaning;Milk Storage Initiated by:: Audryana Hockenberry Date initiated:: 07/31/19   Consult Status Consult Status: Follow-up Date: 08/01/19 Follow-up type: In-patient    Little Ishikawa 07/31/2019, 6:53 PM

## 2019-07-31 NOTE — Anesthesia Procedure Notes (Signed)
Epidural Patient location during procedure: OB Start time: 07/31/2019 10:16 AM End time: 07/31/2019 10:24 AM  Staffing Anesthesiologist: Josephine Igo, MD Performed: anesthesiologist   Preanesthetic Checklist Completed: patient identified, site marked, surgical consent, pre-op evaluation, timeout performed, IV checked, risks and benefits discussed and monitors and equipment checked  Epidural Patient position: sitting Prep: site prepped and draped and DuraPrep Patient monitoring: continuous pulse ox and blood pressure Approach: midline Location: L3-L4 Injection technique: LOR air  Needle:  Needle type: Tuohy  Needle gauge: 17 G Needle length: 9 cm and 9 Needle insertion depth: 6 cm Catheter type: closed end flexible Catheter size: 19 Gauge Catheter at skin depth: 11 cm Test dose: negative and Other  Assessment Events: blood not aspirated, injection not painful, no injection resistance, negative IV test and no paresthesia  Additional Notes Patient identified. Risks and benefits discussed including failed block, incomplete  Pain control, post dural puncture headache, nerve damage, paralysis, blood pressure Changes, nausea, vomiting, reactions to medications-both toxic and allergic and post Partum back pain. All questions were answered. Patient expressed understanding and wished to proceed. Sterile technique was used throughout procedure. Epidural site was Dressed with sterile barrier dressing. No paresthesias, signs of intravascular injection Or signs of intrathecal spread were encountered.  Patient was more comfortable after the epidural was dosed. Please see RN's note for documentation of vital signs and FHR which are stable. Reason for block:procedure for pain

## 2019-07-31 NOTE — H&P (Addendum)
OBSTETRIC ADMISSION HISTORY AND PHYSICAL  Christine Lambert is a 23 y.o. female G3P2002 with IUP at 23w0dby TVUS admitted for LOS. Pregnancy complicated by short cervical length and recurrent urinary tract infections   Presented yesterday pm with LOF and Ctx. She was discharged home after rule out of ROM. Presents today with increased contraction q 4-5 min. Endorses minimal vaginal bleeding but no gush of fluid. Denies HA or vision changes.   Prenatal History/Complications: 1. Short Cervical length 2.82cm-(03/11/19-149w0d viewed and measured by TVUS  -03/29/29- Cervical length noted to be 3.21 cm (2232w0d2. Multiple Urinary tract infections in mother during third trimester of pregnancy  -E. Coli UTI 05/03/19 and 07/05/19  -Pt reported not taking abx  Past Medical History: Past Medical History:  Diagnosis Date  . 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 Headache    migraines    Past Surgical History: Past Surgical History:  Procedure Laterality Date  . NO PAST SURGERIES      Obstetrical History: OB History    Gravida  3   Para  2   Term  2   Preterm  0   AB  0   Living  2     SAB  0   TAB  0   Ectopic  0   Multiple  0   Live Births  2           Social History: Social History   Socioeconomic History  . Marital status: Single    Spouse name: Not on file  . Number of children: Not on file  . Years of education: Not on file  . Highest education level: Not on file  Occupational History  . Not on file  Social Needs  . Financial resource strain: Not on file  . Food insecurity    Worry: Never true    Inability: Never true  . Transportation needs    Medical: No    Non-medical: No  Tobacco Use  . Smoking status: Former Smoker    Types: Cigars    Quit date: 08/18/2015    Years since quitting: 3.9  . Smokeless tobacco: Never Used  Substance and Sexual Activity  . Alcohol use: No  . Drug use: No  . Sexual activity: Not  Currently    Partners: Female, Female    Birth control/protection: None  Lifestyle  . Physical activity    Days per week: Not on file    Minutes per session: Not on file  . Stress: Not on file  Relationships  . Social conHerbalist phone: Not on file    Gets together: Not on file    Attends religious service: Not on file    Active member of club or organization: Not on file    Attends meetings of clubs or organizations: Not on file    Relationship status: Not on file  Other Topics Concern  . Not on file  Social History Narrative   ** Merged History Encounter **        Family History: History reviewed. No pertinent family history.  Allergies: Allergies  Allergen Reactions  . Nickel Rash    Medications Prior to Admission  Medication Sig Dispense Refill Last Dose  . Blood Pressure Monitoring (BLOOD PRESSURE KIT) DEVI 1 Device by Does not apply route daily. ICD 10: Z34.00 1 Device 0   . diphenhydrAMINE (BENADRYL) 25 MG tablet Take 25 mg by mouth at bedtime  as needed for allergies.     . Prenatal Vit-Fe Fumarate-FA (MULTIVITAMIN-PRENATAL) 27-0.8 MG TABS tablet Take 1 tablet by mouth daily at 12 noon.        Review of Systems   All systems reviewed and negative except as stated in HPI  Blood pressure 107/66, pulse 86, temperature 98.4 F (36.9 C), temperature source Oral, resp. rate 18, last menstrual period 12/25/2018, SpO2 98 %, unknown if currently breastfeeding. General appearance: alert Lungs: normal effort Heart: regular rate  Abdomen: soft, non-tender; bowel sounds normal Pelvic: gravid uterus GU: No vaginal lesions Extremities: Homans sign is negative, no sign of DVT Presentation: cephalic   Fetal Monitoring: Baseline: 145 Variability: moderate Accelerations: 15x15 Decelerations: none Contractions: irregular ctx.  Prenatal labs: ABO, Rh: B/Positive/-- (07/01 1458) Antibody: Negative (07/01 1458) Rubella: 1.74 (07/01 1458) RPR: Non Reactive  (07/29 0945)  HBsAg: Negative (07/01 1458)  HIV: Non Reactive (07/29 0945)  GBS: Negative/-- (09/30 1529)  1 hr Glucola Normal Genetic screening None Anatomy US:  6/25 normal anatomy, cephalic presentation, posterior lie, 481 g, 30% EFW  Prenatal Transfer Tool  Maternal Diabetes: No Genetic Screening: Declined Maternal Ultrasounds/Referrals: Normal Fetal Ultrasounds or other Referrals:  None Maternal Substance Abuse:  Yes:  Type: Smoker Significant Maternal Medications:  None Significant Maternal Lab Results: Group B Strep negative  Results for orders placed or performed during the hospital encounter of 07/30/19 (from the past 24 hour(s))  POCT fern test   Collection Time: 07/30/19  7:35 PM  Result Value Ref Range   POCT Fern Test Negative = intact amniotic membranes   POCT fern test   Collection Time: 07/30/19  7:53 PM  Result Value Ref Range   POCT Fern Test Negative = intact amniotic membranes     Patient Active Problem List   Diagnosis Date Noted  . Post term pregnancy 07/31/2019  . Anemia 05/06/2019  . Urinary tract infection in mother during third trimester of pregnancy 05/03/2019  . Supervision of other normal pregnancy, antepartum 04/05/2019  . Arthritis 03/01/2018  . History of migraine headaches 03/01/2018    Assessment/Plan:   Christine Lambert is a 23 y.o. female G3P2002 with IUP at 67w0dby TVUS admitted for LOS. Pregnancy complicated by short cervical length and recurrent urinary tract infections  #Labor: Presented SVE 4.5 '@0747'  > 6 @ 0853. Plan for expectant management of active labor.   #Pain: IV fentanyl and epidural upon patient request  #FWB: Cat 1; EFW: 30% #ID: GBS negative, hx recurrent UTI with TOC #MOF: Breast  #MOC:  Nexplanon  #Circ:  Yes   JMaxie Better PGY-1, MD Labor and Delivery Teaching service  07/31/2019, 9:54 AM     OB FELLOW ATTESTATION  I have seen and examined this patient and agree with above documentation in the  resident's note except as noted below.  No changes  MAugustin Coupe MD/MPH [place]ellow  07/31/2019

## 2019-07-31 NOTE — Anesthesia Preprocedure Evaluation (Signed)
Anesthesia Evaluation  Patient identified by MRN, date of birth, ID band Patient awake    Reviewed: Allergy & Precautions, Patient's Chart, lab work & pertinent test results  Airway Mallampati: II  TM Distance: >3 FB Neck ROM: Full    Dental no notable dental hx. (+) Teeth Intact   Pulmonary former smoker,    Pulmonary exam normal breath sounds clear to auscultation       Cardiovascular negative cardio ROS Normal cardiovascular exam Rhythm:Regular Rate:Normal     Neuro/Psych  Headaches, negative psych ROS   GI/Hepatic negative GI ROS, Neg liver ROS,   Endo/Other  negative endocrine ROS  Renal/GU negative Renal ROS  negative genitourinary   Musculoskeletal  (+) Arthritis , Plaque psoriasis   Abdominal   Peds  Hematology  (+) anemia ,   Anesthesia Other Findings   Reproductive/Obstetrics                             Anesthesia Physical Anesthesia Plan  ASA:   Anesthesia Plan:    Post-op Pain Management:    Induction:   PONV Risk Score and Plan:   Airway Management Planned: Natural Airway  Additional Equipment:   Intra-op Plan:   Post-operative Plan:   Informed Consent: I have reviewed the patients History and Physical, chart, labs and discussed the procedure including the risks, benefits and alternatives for the proposed anesthesia with the patient or authorized representative who has indicated his/her understanding and acceptance.       Plan Discussed with: Anesthesiologist  Anesthesia Plan Comments:         Anesthesia Quick Evaluation

## 2019-07-31 NOTE — MAU Note (Signed)
covid swab collected.Pt asymptomatic, Pt tolerated well

## 2019-07-31 NOTE — Discharge Summary (Addendum)
Postpartum Discharge Summary     Patient Name: Christine Lambert DOB: 04/10/1996 MRN: [id number]  Date of admission: 07/31/2019 Delivering Provider: ELLERY, JOHN B   Date of discharge: 08/01/2019  Admitting diagnosis: 40WKS CTX Intrauterine pregnancy: [redacted]w[redacted]d     Secondary diagnosis:  Active Problems:   Supervision of other normal pregnancy, antepartum   Anemia   Post term pregnancy   SVD (spontaneous vaginal delivery)  Additional problems: hx of migraine headaches and UTI in pregnancy     Discharge diagnosis: Term Pregnancy Delivered                                                                                                Post partum procedures:Nexplanon insertion   Augmentation: None  Complications: None  Hospital course:  Onset of Labor With Vaginal Delivery     23 y.o. yo G3P3003 at [redacted]w[redacted]d was admitted in Latent Labor on 07/31/2019. Patient had an uncomplicated labor course as follows: Arrived to MAU 4cm, progressed spontaneously without augmentation to fully dilated.  Membrane Rupture Time/Date: 11:37 AM ,07/31/2019   Intrapartum Procedures: Episiotomy: None [1]                                         Lacerations:  None [1]  Patient had a delivery of a Viable infant. 07/31/2019  Information for the patient's newborn:  Simoni, Boy Tyniesha [[id number]]  Delivery Method: Vaginal, Spontaneous(Filed from Delivery Summary)     Pateint had an uncomplicated postpartum course.  She is ambulating, tolerating a regular diet, passing flatus, and urinating well. Patient is discharged home in stable condition on 08/01/19.  Delivery time: 11:37 AM    Magnesium Sulfate received: No BMZ received: No Rhophylac:N/A MMR:N/A Transfusion:No  Physical exam  Vitals:   07/31/19 1430 07/31/19 1800 08/01/19 0200 08/01/19 0532  BP: 101/61 101/68 94/68 96/61  Pulse: (!) 57 61 60 67  Resp: 18 18 18 18  Temp: 98 F (36.7 C) 98 F (36.7 C) 98.5 F (36.9 C) 98.1 F (36.7 C)   TempSrc: Oral Oral Oral Oral  SpO2:      Weight:      Height:       General: alert, cooperative and no distress Lochia: appropriate Uterine Fundus: firm Incision: N/A DVT Evaluation: No evidence of DVT seen on physical exam. No significant calf/ankle edema. Labs: Lab Results  Component Value Date   WBC 8.6 08/01/2019   HGB 9.2 (L) 08/01/2019   HCT 29.1 (L) 08/01/2019   MCV 89.8 08/01/2019   PLT 206 08/01/2019   CMP Latest Ref Rng & Units 07/05/2018  Glucose 70 - 99 mg/dL 81  BUN 6 - 20 mg/dL 9  Creatinine 0.44 - 1.00 mg/dL 0.86  Sodium 135 - 145 mmol/L 138  Potassium 3.5 - 5.1 mmol/L 3.6  Chloride 98 - 111 mmol/L 100  CO2 22 - 32 mmol/L 25  Calcium 8.9 - 10.3 mg/dL 9.4  Total Protein 6.5 - 8.1 g/dL 6.8  Total Bilirubin 0.3 - 1.2 mg/dL 0.4    Alkaline Phos 38 - 126 U/L 64  AST 15 - 41 U/L 22  ALT 0 - 44 U/L 16    Discharge instruction: per After Visit Summary and "Baby and Me Booklet".  After visit meds:  Allergies as of 08/01/2019      Reactions   Nickel Rash      Medication List    TAKE these medications   Blood Pressure Kit Devi 1 Device by Does not apply route daily. ICD 10: Z34.00   diphenhydrAMINE 25 MG tablet Commonly known as: BENADRYL Take 25 mg by mouth at bedtime as needed for allergies.   ibuprofen 600 MG tablet Commonly known as: ADVIL Take 1 tablet (600 mg total) by mouth every 6 (six) hours.   multivitamin-prenatal 27-0.8 MG Tabs tablet Take 1 tablet by mouth daily at 12 noon.       Diet: routine diet  Activity: Advance as tolerated. Pelvic rest for 6 weeks.   Outpatient follow up:4 weeks Follow up Appt: Future Appointments  Date Time Provider Department Center  08/29/2019  3:35 PM Eckstat, Matthew M, MD [facility]   Follow up Visit: Follow-up Information    [facility]. Schedule an appointment as soon as possible for a visit in 4 week(s).   Specialty: Obstetrics and Gynecology Why: Make  appointment to be seen in 4 weeks for postpartum care  Contact information: [address], [id number] [place] [address] [phone]           Please schedule this patient for Postpartum visit in: 4 weeks with the following provider: Any provider  For C/S patients schedule nurse incision check in weeks 2 weeks: no  Low risk pregnancy complicated by: n/a  Delivery mode: SVD  Anticipated Birth Control: Nexplanon inpt  PP Procedures needed: none  Schedule Integrated BH visit: no    Newborn Data: Live born female  Birth Weight: 7 lb 3 oz (3260 g) APGAR: 8, 9  Newborn Delivery   Birth date/time: 07/31/2019 11:37:00 Delivery type: Vaginal, Spontaneous      Baby Feeding: Breast Disposition:home with mother   08/01/2019 Veronica C Rogers, CNM   

## 2019-08-01 DIAGNOSIS — Z30017 Encounter for initial prescription of implantable subdermal contraceptive: Secondary | ICD-10-CM

## 2019-08-01 LAB — CBC
HCT: 29.1 % — ABNORMAL LOW (ref 36.0–46.0)
Hemoglobin: 9.2 g/dL — ABNORMAL LOW (ref 12.0–15.0)
MCH: 28.4 pg (ref 26.0–34.0)
MCHC: 31.6 g/dL (ref 30.0–36.0)
MCV: 89.8 fL (ref 80.0–100.0)
Platelets: 206 10*3/uL (ref 150–400)
RBC: 3.24 MIL/uL — ABNORMAL LOW (ref 3.87–5.11)
RDW: 13.5 % (ref 11.5–15.5)
WBC: 8.6 10*3/uL (ref 4.0–10.5)
nRBC: 0 % (ref 0.0–0.2)

## 2019-08-01 MED ORDER — ETONOGESTREL 68 MG ~~LOC~~ IMPL
68.0000 mg | DRUG_IMPLANT | Freq: Once | SUBCUTANEOUS | Status: AC
  Administered 2019-08-01: 68 mg via SUBCUTANEOUS
  Filled 2019-08-01: qty 1

## 2019-08-01 MED ORDER — IBUPROFEN 600 MG PO TABS: 600.0000 mg | ORAL_TABLET | Freq: Four times a day (QID) | ORAL | 0 refills | Status: DC

## 2019-08-01 MED ORDER — LIDOCAINE HCL 1 % IJ SOLN
0.0000 mL | Freq: Once | INTRAMUSCULAR | Status: DC | PRN
  Filled 2019-08-01: qty 20

## 2019-08-01 NOTE — Lactation Note (Signed)
This note was copied from a baby's chart. Lactation Consultation Note  Patient Name: Christine Lambert DUKGU'R Date: 08/01/2019 Reason for consult: Follow-up assessment  LC Follow Up Visit:  P3 mother whose infant is now 58 hours old.  Mother breast fed her first child (now 23 years old) for 4 months and her second child (now 73 year old) for 3 weeks.  Mother desires to pump and bottle feed only with this third child.  Mother was holding baby when I arrived.  She had no questions/concerns related to feeding.  Mother has now been able to pump approximately 5 mls at her last pumping session.  She is supplementing with Jerlyn Ly Start.  Reviewed feeding supplementation guidelines and encouraged mother to increase supplementation volumes today.  Mother verbalized understanding.    [place] referral has been faxed and mother will follow up to determine eligibility to obtain a DEBP after discharge.  Father present.    Consult Status Consult Status: Follow-up Date: 08/02/19 Follow-up type: In-patient    Ndia Sampath R Shawnmichael Parenteau 08/01/2019, 9:16 AM

## 2019-08-01 NOTE — Discharge Instructions (Signed)
Marland KitchenVaginal Delivery Care After Refer to this sheet in the next few weeks. These discharge instructions provide you with information on caring for yourself after delivery. Your caregiver may also give you specific instructions. Your treatment has been planned according to the most current medical practices available, but problems sometimes occur. Call your caregiver if you have any problems or questions after you go home. HOME CARE INSTRUCTIONS  Take over-the-counter or prescription medicines only as directed by your caregiver or pharmacist.  Do not drink alcohol, especially if you are breastfeeding or taking medicine to relieve pain.  Do not chew or smoke tobacco.  Do not use illegal drugs.  Continue to use good perineal care. Good perineal care includes:  Wiping your perineum from front to back.  Keeping your perineum clean.  Do not use tampons or douche until your caregiver says it is okay.  Shower, wash your hair, and take tub baths as directed by your caregiver.  Wear a well-fitting bra that provides breast support.  Eat healthy foods.  Drink enough fluids to keep your urine clear or pale yellow.  Eat high-fiber foods such as whole grain cereals and breads, brown rice, beans, and fresh fruits and vegetables every day. These foods may help prevent or relieve constipation.  Follow your cargiver's recommendations regarding resumption of activities such as climbing stairs, driving, lifting, exercising, or traveling.  Talk to your caregiver about resuming sexual activities. Resumption of sexual activities is dependent upon your risk of infection, your rate of healing, and your comfort and desire to resume sexual activity.  Try to have someone help you with your household activities and your newborn for at least a few days after you leave the hospital.  Rest as much as possible. Try to rest or take a nap when your newborn is sleeping.  Increase your activities gradually.  Keep  all of your scheduled postpartum appointments. It is very important to keep your scheduled follow-up appointments. At these appointments, your caregiver will be checking to make sure that you are healing physically and emotionally. SEEK MEDICAL CARE IF:   You are passing large clots from your vagina. Save any clots to show your caregiver.  You have a foul smelling discharge from your vagina.  You have trouble urinating.  You are urinating frequently.  You have pain when you urinate.  You have a change in your bowel movements.  You have increasing redness, pain, or swelling near your vaginal incision (episiotomy) or vaginal tear.  You have pus draining from your episiotomy or vaginal tear.  Your episiotomy or vaginal tear is separating.  You have painful, hard, or reddened breasts.  You have a severe headache.  You have blurred vision or see spots.  You feel sad or depressed.  You have thoughts of hurting yourself or your newborn.  You have questions about your care, the care of your newborn, or medicines.  You are dizzy or lightheaded.  You have a rash.  You have nausea or vomiting.  You were breastfeeding and have not had a menstrual period within 12 weeks after you stopped breastfeeding.  You are not breastfeeding and have not had a menstrual period by the 12th week after delivery.  You have a fever. SEEK IMMEDIATE MEDICAL CARE IF:   You have persistent pain.  You have chest pain.  You have shortness of breath.  You faint.  You have leg pain.  You have stomach pain.  Your vaginal bleeding saturates two or more sanitary pads  in 1 hour. MAKE SURE YOU:   Understand these instructions.  Will watch your condition.  Will get help right away if you are not doing well or get worse. Document Released: 09/18/2000 Document Revised: 06/15/2012 Document Reviewed: 05/18/2012 [facility] Patient Information 2014 [place], [place].   Etonogestrel implant What is  this medicine? ETONOGESTREL (et oh noe JES trel) is a contraceptive (birth control) device. It is used to prevent pregnancy. It can be used for up to 3 years. This medicine may be used for other purposes; ask your health care provider or pharmacist if you have questions. COMMON BRAND NAME(S): Implanon, Nexplanon What should I tell my health care provider before I take this medicine? They need to know if you have any of these conditions:  abnormal vaginal bleeding  blood vessel disease or blood clots  breast, cervical, endometrial, ovarian, liver, or uterine cancer  diabetes  gallbladder disease  heart disease or recent heart attack  high blood pressure  high cholesterol or triglycerides  kidney disease  liver disease  migraine headaches  seizures  stroke  tobacco smoker  an unusual or allergic reaction to etonogestrel, anesthetics or antiseptics, other medicines, foods, dyes, or preservatives  pregnant or trying to get pregnant  breast-feeding How should I use this medicine? This device is inserted just under the skin on the inner side of your upper arm by a health care professional. Talk to your pediatrician regarding the use of this medicine in children. Special care may be needed. Overdosage: If you think you have taken too much of this medicine contact a poison control center or emergency room at once. NOTE: This medicine is only for you. Do not share this medicine with others. What if I miss a dose? This does not apply. What may interact with this medicine? Do not take this medicine with any of the following medications:  amprenavir  fosamprenavir This medicine may also interact with the following medications:  acitretin  aprepitant  armodafinil  bexarotene  bosentan  carbamazepine  certain medicines for fungal infections like fluconazole, ketoconazole, itraconazole and voriconazole  certain medicines to treat hepatitis, HIV or  AIDS  cyclosporine  felbamate  griseofulvin  lamotrigine  modafinil  oxcarbazepine  phenobarbital  phenytoin  primidone  rifabutin  rifampin  rifapentine  St. John's wort  topiramate This list may not describe all possible interactions. Give your health care provider a list of all the medicines, herbs, non-prescription drugs, or dietary supplements you use. Also tell them if you smoke, drink alcohol, or use illegal drugs. Some items may interact with your medicine. What should I watch for while using this medicine? This product does not protect you against HIV infection (AIDS) or other sexually transmitted diseases. You should be able to feel the implant by pressing your fingertips over the skin where it was inserted. Contact your doctor if you cannot feel the implant, and use a non-hormonal birth control method (such as condoms) until your doctor confirms that the implant is in place. Contact your doctor if you think that the implant may have broken or become bent while in your arm. You will receive a user card from your health care provider after the implant is inserted. The card is a record of the location of the implant in your upper arm and when it should be removed. Keep this card with your health records. What side effects may I notice from receiving this medicine? Side effects that you should report to your doctor or health care  professional as soon as possible:  allergic reactions like skin rash, itching or hives, swelling of the face, lips, or tongue  breast lumps, breast tissue changes, or discharge  breathing problems  changes in emotions or moods  if you feel that the implant may have broken or bent while in your arm  high blood pressure  pain, irritation, swelling, or bruising at the insertion site  scar at site of insertion  signs of infection at the insertion site such as fever, and skin redness, pain or discharge  signs and symptoms of a blood  clot such as breathing problems; changes in vision; chest pain; severe, sudden headache; pain, swelling, warmth in the leg; trouble speaking; sudden numbness or weakness of the face, arm or leg  signs and symptoms of liver injury like dark yellow or brown urine; general ill feeling or flu-like symptoms; light-colored stools; loss of appetite; nausea; right upper belly pain; unusually weak or tired; yellowing of the eyes or skin  unusual vaginal bleeding, discharge Side effects that usually do not require medical attention (report to your doctor or health care professional if they continue or are bothersome):  acne  breast pain or tenderness  headache  irregular menstrual bleeding  nausea This list may not describe all possible side effects. Call your doctor for medical advice about side effects. You may report side effects to FDA at 1-800-FDA-1088. Where should I keep my medicine? This drug is given in a hospital or clinic and will not be stored at home. NOTE: This sheet is a summary. It may not cover all possible information. If you have questions about this medicine, talk to your doctor, pharmacist, or health care provider.  2020 Elsevier/Gold Standard (2017-08-10 14:11:42)

## 2019-08-01 NOTE — Procedures (Signed)
Nexplanon Insertion Procedure Patient identified, informed consent performed, consent signed.   Patient does understand that irregular bleeding is a very common side effect of this medication. She was advised to have backup contraception for one week after placement. Pregnancy test in clinic today was negative.  Appropriate time out taken.  Patient's left arm was prepped and draped in the usual sterile fashion. The ruler used to measure and mark insertion area.  Patient was prepped with alcohol swab and then injected with 3 ml of 1% lidocaine.  She was prepped with betadine, Nexplanon removed from packaging,  Device confirmed in needle, then inserted full length of needle and withdrawn per handbook instructions. Nexplanon was able to palpated in the patient's arm; patient palpated the insert herself. There was minimal blood loss.  Patient insertion site covered with guaze and a pressure bandage to reduce any bruising.  The patient tolerated the procedure well and was given post procedure instructions.   Mallie Snooks, MSN, CNM Certified Nurse Midwife, [facility] for [employer], [place] 08/01/19 10:17 AM

## 2019-08-01 NOTE — Plan of Care (Signed)
Progressing appropriately. Encouraged to call for assistance as needed.  

## 2019-08-02 ENCOUNTER — Inpatient Hospital Stay (HOSPITAL_COMMUNITY): Payer: Medicaid Other

## 2019-08-02 ENCOUNTER — Inpatient Hospital Stay (HOSPITAL_COMMUNITY): Admission: AD | Admit: 2019-08-02 | Payer: Medicaid Other | Source: Home / Self Care | Admitting: Family Medicine

## 2019-08-29 ENCOUNTER — Ambulatory Visit: Payer: Medicaid Other | Admitting: Family Medicine

## 2019-09-19 ENCOUNTER — Encounter: Payer: Self-pay | Admitting: General Practice

## 2022-06-14 ENCOUNTER — Encounter (HOSPITAL_BASED_OUTPATIENT_CLINIC_OR_DEPARTMENT_OTHER): Payer: Self-pay | Admitting: Emergency Medicine

## 2022-06-14 ENCOUNTER — Other Ambulatory Visit: Payer: Self-pay

## 2022-06-14 ENCOUNTER — Emergency Department (HOSPITAL_BASED_OUTPATIENT_CLINIC_OR_DEPARTMENT_OTHER): Payer: Self-pay

## 2022-06-14 ENCOUNTER — Emergency Department (HOSPITAL_BASED_OUTPATIENT_CLINIC_OR_DEPARTMENT_OTHER)
Admission: EM | Admit: 2022-06-14 | Discharge: 2022-06-14 | Disposition: A | Discharge status: Home / Self Care | Payer: Self-pay | Attending: Emergency Medicine | Admitting: Emergency Medicine

## 2022-06-14 DIAGNOSIS — J189 Pneumonia, unspecified organism: Secondary | ICD-10-CM

## 2022-06-14 DIAGNOSIS — Z20822 Contact with and (suspected) exposure to covid-19: Secondary | ICD-10-CM | POA: Insufficient documentation

## 2022-06-14 DIAGNOSIS — J181 Lobar pneumonia, unspecified organism: Secondary | ICD-10-CM | POA: Insufficient documentation

## 2022-06-14 LAB — SARS CORONAVIRUS 2 BY RT PCR: SARS Coronavirus 2 by RT PCR: NEGATIVE

## 2022-06-14 MED ORDER — DEXAMETHASONE SODIUM PHOSPHATE 10 MG/ML IJ SOLN
10.0000 mg | Freq: Once | INTRAMUSCULAR | Status: AC
  Administered 2022-06-14: 10 mg via INTRAMUSCULAR
  Filled 2022-06-14: qty 1

## 2022-06-14 MED ORDER — ALBUTEROL SULFATE HFA 108 (90 BASE) MCG/ACT IN AERS
4.0000 | INHALATION_SPRAY | Freq: Once | RESPIRATORY_TRACT | Status: AC
  Administered 2022-06-14: 4 via RESPIRATORY_TRACT
  Filled 2022-06-14: qty 6.7

## 2022-06-14 MED ORDER — DOXYCYCLINE HYCLATE 100 MG PO TABS
100.0000 mg | ORAL_TABLET | Freq: Once | ORAL | Status: AC
  Administered 2022-06-14: 100 mg via ORAL
  Filled 2022-06-14: qty 1

## 2022-06-14 MED ORDER — DOXYCYCLINE HYCLATE 100 MG PO CAPS: 100.0000 mg | ORAL_CAPSULE | Freq: Two times a day (BID) | ORAL | 0 refills | Status: AC

## 2022-06-14 MED ORDER — AEROCHAMBER PLUS FLO-VU MEDIUM MISC
1.0000 | Freq: Once | Status: AC
  Administered 2022-06-14: 1
  Filled 2022-06-14: qty 1

## 2022-06-14 MED ORDER — DOXYCYCLINE HYCLATE 100 MG PO CAPS: 100.0000 mg | ORAL_CAPSULE | Freq: Two times a day (BID) | ORAL | 0 refills | Status: DC

## 2022-06-14 NOTE — ED Triage Notes (Signed)
Pt c/o cough, mucous with blood, headache and shortness of breath

## 2022-06-14 NOTE — ED Provider Notes (Signed)
[facility] EMERGENCY DEPARTMENT Provider Note   CSN: [id number] Arrival date & time: 06/14/22  1246     History  Chief Complaint  Patient presents with   Cough    Christine Lambert is a 26 y.o. female who presents to the emergency department for evaluation of productive cough with green mucus and blood, headaches, shortness of breath and body aches.  She states that her eldest daughter came home sick from school little under 1 week ago.  Progressively, each member of her family has gotten sick.  She denies fever, chills, abdominal pain, nausea, vomiting and diarrhea.  Her most concerning symptom is the severe cough.  She has tried over-the-counter cold and flu medications without significant relief in symptoms.   Cough Associated symptoms: shortness of breath   Associated symptoms: no fever        Home Medications Prior to Admission medications   Medication Sig Start Date End Date Taking? Authorizing Provider  doxycycline (VIBRAMYCIN) 100 MG capsule Take 1 capsule (100 mg total) by mouth 2 (two) times daily for 5 days. 06/14/22 06/19/22 Yes Tonye Pearson, PA-C  Blood Pressure Monitoring (BLOOD PRESSURE KIT) DEVI 1 Device by Does not apply route daily. ICD 10: Z34.00 06/29/19   Lavonia Drafts, MD  diphenhydrAMINE (BENADRYL) 25 MG tablet Take 25 mg by mouth at bedtime as needed for allergies.    [provider]  ibuprofen (ADVIL) 600 MG tablet Take 1 tablet (600 mg total) by mouth every 6 (six) hours. 08/01/19   Lajean Manes, CNM  Prenatal Vit-Fe Fumarate-FA (MULTIVITAMIN-PRENATAL) 27-0.8 MG TABS tablet Take 1 tablet by mouth daily at 12 noon.    [provider]      Allergies    Nickel    Review of Systems   Review of Systems  Constitutional:  Negative for fever.  Respiratory:  Positive for cough and shortness of breath.   Gastrointestinal:  Negative for abdominal pain, diarrhea, nausea and vomiting.  Genitourinary:  Negative for  dyspareunia and dysuria.    Physical Exam Updated Vital Signs BP 107/73 (BP Location: Right Arm)   Pulse 95   Temp 98.7 F (37.1 C)   Resp 18   Ht '5\' 1"'  (1.549 m)   Wt 52.6 kg   LMP 06/14/2022   SpO2 95%   Breastfeeding No   BMI 21.92 kg/m  Physical Exam Vitals and nursing note reviewed.  Constitutional:      General: She is not in acute distress.    Appearance: She is not ill-appearing.  HENT:     Head: Atraumatic.     Mouth/Throat:     Pharynx: Uvula midline. Posterior oropharyngeal erythema present. No oropharyngeal exudate.  Eyes:     Conjunctiva/sclera: Conjunctivae normal.  Cardiovascular:     Rate and Rhythm: Normal rate and regular rhythm.     Pulses: Normal pulses.     Heart sounds: No murmur heard. Pulmonary:     Effort: Pulmonary effort is normal. No respiratory distress.     Breath sounds: Examination of the right-middle field reveals rhonchi. Examination of the right-lower field reveals rhonchi. Wheezing and rhonchi present.  Abdominal:     General: Abdomen is flat. There is no distension.     Palpations: Abdomen is soft.     Tenderness: There is no abdominal tenderness.  Musculoskeletal:        General: Normal range of motion.     Cervical back: Normal range of motion.  Skin:  General: Skin is warm and dry.     Capillary Refill: Capillary refill takes less than 2 seconds.  Neurological:     General: No focal deficit present.     Mental Status: She is alert.  Psychiatric:        Mood and Affect: Mood normal.     ED Results / Procedures / Treatments   Labs (all labs ordered are listed, but only abnormal results are displayed) Labs Reviewed  SARS CORONAVIRUS 2 BY RT PCR    EKG None  Radiology DG Chest 2 View  Result Date: 06/14/2022 CLINICAL DATA:  Wheezing, possible rhonchi. Headache and shortness of breath. EXAM: CHEST - 2 VIEW COMPARISON:  None Available. FINDINGS: Ill-defined airspace opacity at the RIGHT costophrenic angle, RIGHT  middle lobe based on the lateral projection. LEFT lung is clear. No pleural effusion or pneumothorax is seen. Osseous structures about the chest are unremarkable. IMPRESSION: RIGHT middle lobe pneumonia. Electronically Signed   By: Franki Cabot M.D.   On: 06/14/2022 15:38    Procedures Procedures    Medications Ordered in ED Medications  albuterol (VENTOLIN HFA) 108 (90 Base) MCG/ACT inhaler 4 puff (has no administration in time range)  AeroChamber Plus Flo-Vu Medium MISC 1 each (has no administration in time range)  dexamethasone (DECADRON) injection 10 mg (10 mg Intramuscular Given 06/14/22 1551)  doxycycline (VIBRA-TABS) tablet 100 mg (100 mg Oral Given 06/14/22 1605)    ED Course/ Medical Decision Making/ A&P                           Medical Decision Making Amount and/or Complexity of Data Reviewed Radiology: ordered.  Risk Prescription drug management.   26 year old female presents emergency department for evaluation of cough, wheezing and numerous other complaints per HPI.  Differentials include viral upper respiratory infection, pneumonia, PE, vitals are without significant abnormality.  On exam she is overall well-appearing.  She is satting at 95% O2 on room air and speaking in full and complete sentences.  Lungs with diffuse biphasic wheezing and rhonchi noted in the right middle and lower lobes.  I ordered chest x-ray which showed right middle lobe pneumonia.  She was given albuterol inhaler here in the emergency department along with a Decadron IM injection.  We will first dose of doxycycline also given here and discharged home with 5-day course.  Return precautions discussed including worsening symptoms, difficulty breathing or nonresponsiveness to antibiotics.  Patient expresses understanding is amenable to plan.  Discharged home in stable condition. Final Clinical Impression(s) / ED Diagnoses Final diagnoses:  Pneumonia of right middle lobe due to infectious organism     Rx / DC Orders ED Discharge Orders          Ordered    doxycycline (VIBRAMYCIN) 100 MG capsule  2 times daily        06/14/22 1554              Tonye Pearson, [place] 06/14/22 1619    Cristie Hem, MD 06/15/22 1325

## 2022-06-14 NOTE — ED Notes (Signed)
Cough wheezing per patient headache States she feel shortness of breath Patient is breathing non labored Body ache States that she has green phlegm with some blood in it

## 2022-06-14 NOTE — Discharge Instructions (Signed)
Your x-ray shows that you have pneumonia in your right middle lobe.  I have given you an injection of steroids here in the emergency department which should last you for several days.  I have also given your first dose of antibiotic tonight in case you are unable to fill it until tomorrow.  Please take the antibiotic twice daily for 5 days.  I have also sent you home with an inhaler that you can use should you feel short of breath or wheezing.  Continue to use over-the-counter Mucinex or Robitussin to help with congestion.  Return if you have worsening symptoms.

## 2022-09-01 ENCOUNTER — Emergency Department (HOSPITAL_BASED_OUTPATIENT_CLINIC_OR_DEPARTMENT_OTHER)
Admission: EM | Admit: 2022-09-01 | Discharge: 2022-09-01 | Disposition: A | Discharge status: Home / Self Care | Payer: Self-pay | Attending: Emergency Medicine | Admitting: Emergency Medicine

## 2022-09-01 ENCOUNTER — Encounter (HOSPITAL_BASED_OUTPATIENT_CLINIC_OR_DEPARTMENT_OTHER): Payer: Self-pay

## 2022-09-01 ENCOUNTER — Emergency Department (HOSPITAL_BASED_OUTPATIENT_CLINIC_OR_DEPARTMENT_OTHER): Payer: Self-pay

## 2022-09-01 DIAGNOSIS — R1110 Vomiting, unspecified: Secondary | ICD-10-CM | POA: Insufficient documentation

## 2022-09-01 DIAGNOSIS — F17210 Nicotine dependence, cigarettes, uncomplicated: Secondary | ICD-10-CM | POA: Insufficient documentation

## 2022-09-01 DIAGNOSIS — Z1152 Encounter for screening for COVID-19: Secondary | ICD-10-CM | POA: Insufficient documentation

## 2022-09-01 DIAGNOSIS — Z7951 Long term (current) use of inhaled steroids: Secondary | ICD-10-CM | POA: Insufficient documentation

## 2022-09-01 DIAGNOSIS — J069 Acute upper respiratory infection, unspecified: Secondary | ICD-10-CM

## 2022-09-01 LAB — RESP PANEL BY RT-PCR (FLU A&B, COVID) ARPGX2
Influenza A by PCR: NEGATIVE
Influenza B by PCR: NEGATIVE
SARS Coronavirus 2 by RT PCR: NEGATIVE

## 2022-09-01 MED ORDER — PREDNISONE 10 MG (21) PO TBPK: ORAL_TABLET | Freq: Every day | ORAL | 0 refills | Status: DC

## 2022-09-01 MED ORDER — GUAIFENESIN ER 600 MG PO TB12: 600.0000 mg | ORAL_TABLET | Freq: Two times a day (BID) | ORAL | 0 refills | Status: DC

## 2022-09-01 MED ORDER — IPRATROPIUM-ALBUTEROL 0.5-2.5 (3) MG/3ML IN SOLN
3.0000 mL | Freq: Once | RESPIRATORY_TRACT | Status: AC
  Administered 2022-09-01: 3 mL via RESPIRATORY_TRACT
  Filled 2022-09-01: qty 3

## 2022-09-01 MED ORDER — GUAIFENESIN-CODEINE 100-10 MG/5ML PO SOLN: 5.0000 mL | Freq: Two times a day (BID) | ORAL | 0 refills | Status: DC | PRN

## 2022-09-01 MED ORDER — GUAIFENESIN-CODEINE 100-10 MG/5ML PO SOLN
10.0000 mL | Freq: Once | ORAL | Status: AC
  Administered 2022-09-01: 10 mL via ORAL
  Filled 2022-09-01: qty 10

## 2022-09-01 MED ORDER — ALBUTEROL SULFATE HFA 108 (90 BASE) MCG/ACT IN AERS: 1.0000 | INHALATION_SPRAY | Freq: Four times a day (QID) | RESPIRATORY_TRACT | 0 refills | Status: DC | PRN

## 2022-09-01 NOTE — ED Triage Notes (Signed)
Pt had pneumonia on 9/10, completed abx and steroids with improvement. Got a cold after and has had a continued cough since. States has been using albuterol inhaler with relief but ran out. States gets coughing fits causing her to gag and vomit.

## 2022-09-01 NOTE — ED Notes (Addendum)
ED Provider at bedside. 

## 2022-09-01 NOTE — Discharge Instructions (Signed)
It was a pleasure caring for you today in the emergency department. ° °Please return to the emergency department for any worsening or worrisome symptoms. ° ° °

## 2022-09-01 NOTE — ED Provider Notes (Signed)
[facility] EMERGENCY DEPARTMENT Provider Note   CSN: [id number] Arrival date & time: 09/01/22  0754     History  Chief Complaint  Patient presents with   Cough    Christine Lambert is a 26 y.o. female.  Patient as above with significant medical history as below, including arthritis, migraines who presents to the ED with complaint of cough.   Pt seen 9/10, dx with pna, started on doxy. Also given decadron. Discharged in stable condition   Reports she did feel better after treatment, her children also improved. Unfortunately in the past week or so her child has fallen ill again with a respiratory illness She thinks she caught what her daughter was dealing with Ongoing cough, not productive Having lots of congestion/mucus Having chest tightness, did improve with albuterol inhaler but she has run out No hx asthma, she does smoke No home o2 use, no hx pulmonary dz she is aware of No fever or chills She is having some post tussive emesis but other wise no emesis, tolerating PO w/o difficulty No other medications pta other than albuterol inhaler, no anti tussive     Past Medical History:  Diagnosis Date   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 No language interpreter was used.  Cough Associated symptoms: no chest pain, no eye discharge, no fever, no headaches, no rash and no shortness of breath        Home Medications Prior to Admission medications   Medication Sig Start Date End Date Taking? Authorizing Provider  albuterol (VENTOLIN HFA) 108 (90 Base) MCG/ACT inhaler Inhale 1-2 puffs into the lungs every 6 (six) hours as needed for wheezing or shortness of breath. 09/01/22  Yes Wynona Dove A, DO  guaiFENesin-codeine 100-10 MG/5ML syrup Take 5 mLs by mouth 2 (two) times daily as needed for  cough. 09/01/22  Yes Wynona Dove A, DO  predniSONE (STERAPRED UNI-PAK 21 TAB) 10 MG (21) TBPK tablet Take by mouth daily. Take 6 tabs by mouth daily  for 2 days, then 5 tabs for 2 days, then 4 tabs for 2 days, then 3 tabs for 2 days, 2 tabs for 2 days, then 1 tab by mouth daily for 2 days 09/01/22  Yes Wynona Dove A, DO  Blood Pressure Monitoring (BLOOD PRESSURE KIT) DEVI 1 Device by Does not apply route daily. ICD 10: Z34.00 06/29/19   Lavonia Drafts, MD  diphenhydrAMINE (BENADRYL) 25 MG tablet Take 25 mg by mouth at bedtime as needed for allergies.    [provider]  ibuprofen (ADVIL) 600 MG tablet Take 1 tablet (600 mg total) by mouth every 6 (six) hours. 08/01/19   Lajean Manes, CNM  Prenatal Vit-Fe Fumarate-FA (MULTIVITAMIN-PRENATAL) 27-0.8 MG TABS tablet Take 1 tablet by mouth daily at 12 noon.    [provider]      Allergies    Nickel    Review of Systems   Review of Systems  Constitutional:  Negative for activity change and fever.  HENT:  Positive for congestion. Negative for facial swelling and trouble swallowing.   Eyes:  Negative for discharge and redness.  Respiratory:  Positive for cough and chest tightness. Negative for shortness of breath.   Cardiovascular:  Negative for chest pain and palpitations.  Gastrointestinal:  Positive  for vomiting. Negative for abdominal pain and nausea.       Post tussive emesis   Genitourinary:  Negative for dysuria and flank pain.  Musculoskeletal:  Negative for back pain and gait problem.  Skin:  Negative for pallor and rash.  Neurological:  Negative for syncope and headaches.    Physical Exam Updated Vital Signs BP 95/69   Pulse 94   Temp 99.1 F (37.3 C) (Oral)   Resp 18   Ht _0  (1.549 m)   Wt 53.1 kg   LMP 07/30/2022   SpO2 94%   BMI 22.11 kg/m  Physical Exam Vitals and nursing note reviewed.  Constitutional:      General: She is not in acute distress.    Appearance: Normal appearance.   HENT:     Head: Normocephalic and atraumatic.     Right Ear: External ear normal.     Left Ear: External ear normal.     Nose: Nose normal.     Mouth/Throat:     Mouth: Mucous membranes are moist.  Eyes:     General: No scleral icterus.       Right eye: No discharge.        Left eye: No discharge.  Cardiovascular:     Rate and Rhythm: Normal rate and regular rhythm.     Pulses: Normal pulses.     Heart sounds: Normal heart sounds.  Pulmonary:     Effort: Pulmonary effort is normal. No tachypnea, accessory muscle usage or respiratory distress.     Breath sounds: No stridor. Wheezing present. No rales.     Comments: Trace wheezing b/l Abdominal:     General: Abdomen is flat.     Palpations: Abdomen is soft.     Tenderness: There is no abdominal tenderness. There is no guarding or rebound.  Musculoskeletal:        General: Normal range of motion.     Cervical back: Normal range of motion.     Right lower leg: No edema.     Left lower leg: No edema.  Skin:    General: Skin is warm and dry.     Capillary Refill: Capillary refill takes less than 2 seconds.  Neurological:     Mental Status: She is alert and oriented to person, place, and time.     GCS: GCS eye subscore is 4. GCS verbal subscore is 5. GCS motor subscore is 6.  Psychiatric:        Mood and Affect: Mood normal.        Behavior: Behavior normal.     ED Results / Procedures / Treatments   Labs (all labs ordered are listed, but only abnormal results are displayed) Labs Reviewed  RESP PANEL BY RT-PCR (FLU A&B, COVID) [id number]    EKG None  Radiology DG Chest Portable 1 View  Result Date: 09/01/2022 CLINICAL DATA:  Cough EXAM: PORTABLE CHEST 1 VIEW COMPARISON:  Radiograph 06/14/2022 FINDINGS: Cardiomediastinal silhouette is unchanged. Resolved right peripheral basilar airspace disease. Mild haziness of the basilar lungs is favored to be related to overlying soft tissue. There is no definite new airspace disease.  There is no pleural effusion or pneumothorax. There is no acute osseous abnormality. IMPRESSION: Resolved airspace disease in the right peripheral lower lung. No definite new airspace disease. Electronically Signed   By: Maurine Simmering M.D.   On: 09/01/2022 08:37    Procedures Procedures    Medications Ordered in ED Medications  ipratropium-albuterol (DUONEB) 0.5-2.5 (  3) MG/3ML nebulizer solution 3 mL (3 mLs Nebulization Given 09/01/22 0854)  guaiFENesin-codeine 100-10 MG/5ML solution 10 mL (10 mLs Oral Given 09/01/22 0846)    ED Course/ Medical Decision Making/ A&P Clinical Course as of 09/01/22 8016  Tue Sep 01, 2022  0855 CXR with resolved pna [SG]    Clinical Course User Index [SG] Jeanell Sparrow, DO                           Medical Decision Making Amount and/or Complexity of Data Reviewed Radiology: ordered.  Risk OTC drugs. Prescription drug management.   This patient presents to the ED with chief complaint(s) of cough, dib with pertinent past medical history of as above which further complicates the presenting complaint. The complaint involves an extensive differential diagnosis and also carries with it a high risk of complications and morbidity.    The differential diagnosis includes but not limited to In my evaluation of this patient's dyspnea my DDx includes, but is not limited to, pneumonia, pulmonary embolism, pneumothorax, pulmonary edema, metabolic acidosis, asthma, COPD, cardiac cause, anemia, anxiety, etc.   . Serious etiologies were considered.   The initial plan is to RVP, cxr, duoneb, anti-tussive   Additional history obtained: Additional history obtained from  na Records reviewed  prior ed visits, home meds, prior labs/imaging  Independent labs interpretation:  The following labs were independently interpreted:  RVP  Independent visualization of imaging: - I independently visualized the following imaging with scope of interpretation limited to  determining acute life threatening conditions related to emergency care: CXR, which revealed resolved pna  Cardiac monitoring was reviewed and interpreted by myself which shows na  Treatment and Reassessment: Duoneb Anti-tussive Improved >> lungs clear   Consultation: - Consulted or discussed management/test interpretation w/ external professional: na  Consideration for admission or further workup: Admission was considered   Pt with likely viral syndrome, will give albuterol/anti tussive/mucinex; she is on ambient air.    The patient improved significantly and was discharged in stable condition. Detailed discussions were had with the patient regarding current findings, and need for close f/u with PCP or on call doctor. The patient has been instructed to return immediately if the symptoms worsen in any way for re-evaluation. Patient verbalized understanding and is in agreement with current care plan. All questions answered prior to discharge.   Social Determinants of health: Counseled patient for approximately 3 minutes regarding smoking cessation. Discussed risks of smoking and how they applied and affected their visit here today. Patient not ready to quit at this time, however will follow up with their primary doctor when they are.   CPT code: [phone]: intermediate counseling for smoking cessation   Social History   Tobacco Use   Smoking status: Every Day    Types: Cigars, Cigarettes    Last attempt to quit: 08/18/2015    Years since quitting: 7.0   Smokeless tobacco: Never  Vaping Use   Vaping Use: Never used  Substance Use Topics   Alcohol use: No   Drug use: No            Final Clinical Impression(s) / ED Diagnoses Final diagnoses:  Viral URI with cough    Rx / DC Orders ED Discharge Orders          Ordered    predniSONE (STERAPRED UNI-PAK 21 TAB) 10 MG (21) TBPK tablet  Daily        09/01/22 0918  albuterol (VENTOLIN HFA) 108 (90 Base) MCG/ACT inhaler   Every 6 hours PRN        09/01/22 0918    guaiFENesin-codeine 100-10 MG/5ML syrup  2 times daily PRN        09/01/22 0918    guaiFENesin (MUCINEX) 600 MG 12 hr tablet  2 times daily,   Status:  Discontinued        09/01/22 [place], [place], DO 09/01/22 [phone]

## 2023-03-29 ENCOUNTER — Emergency Department (HOSPITAL_BASED_OUTPATIENT_CLINIC_OR_DEPARTMENT_OTHER)
Admission: EM | Admit: 2023-03-29 | Discharge: 2023-03-29 | Disposition: A | Discharge status: Home / Self Care | Payer: 59 | Attending: Emergency Medicine | Admitting: Emergency Medicine

## 2023-03-29 ENCOUNTER — Other Ambulatory Visit: Payer: Self-pay

## 2023-03-29 ENCOUNTER — Emergency Department (HOSPITAL_BASED_OUTPATIENT_CLINIC_OR_DEPARTMENT_OTHER): Payer: 59

## 2023-03-29 ENCOUNTER — Encounter (HOSPITAL_BASED_OUTPATIENT_CLINIC_OR_DEPARTMENT_OTHER): Payer: Self-pay | Admitting: Emergency Medicine

## 2023-03-29 DIAGNOSIS — R110 Nausea: Secondary | ICD-10-CM | POA: Insufficient documentation

## 2023-03-29 DIAGNOSIS — S058X2A Other injuries of left eye and orbit, initial encounter: Secondary | ICD-10-CM | POA: Diagnosis not present

## 2023-03-29 DIAGNOSIS — Z23 Encounter for immunization: Secondary | ICD-10-CM | POA: Diagnosis not present

## 2023-03-29 DIAGNOSIS — F17210 Nicotine dependence, cigarettes, uncomplicated: Secondary | ICD-10-CM | POA: Insufficient documentation

## 2023-03-29 DIAGNOSIS — S0591XA Unspecified injury of right eye and orbit, initial encounter: Secondary | ICD-10-CM | POA: Diagnosis present

## 2023-03-29 DIAGNOSIS — S0231XA Fracture of orbital floor, right side, initial encounter for closed fracture: Secondary | ICD-10-CM | POA: Insufficient documentation

## 2023-03-29 HISTORY — DX: Migraine, unspecified, not intractable, without status migrainosus: G43.909

## 2023-03-29 HISTORY — DX: Psoriasis, unspecified: L40.9

## 2023-03-29 HISTORY — DX: Arthropathic psoriasis, unspecified: L40.50

## 2023-03-29 MED ORDER — IBUPROFEN 600 MG PO TABS: 600.0000 mg | ORAL_TABLET | Freq: Four times a day (QID) | ORAL | 0 refills | Status: AC | PRN

## 2023-03-29 MED ORDER — ONDANSETRON 4 MG PO TBDP
8.0000 mg | ORAL_TABLET | Freq: Once | ORAL | Status: AC
  Administered 2023-03-29: 8 mg via ORAL
  Filled 2023-03-29: qty 2

## 2023-03-29 MED ORDER — FLUORESCEIN SODIUM 1 MG OP STRP
ORAL_STRIP | OPHTHALMIC | Status: AC
  Administered 2023-03-29: 1 via OPHTHALMIC
  Filled 2023-03-29: qty 1

## 2023-03-29 MED ORDER — FLUORESCEIN SODIUM 1 MG OP STRP: 1.0000 | ORAL_STRIP | Freq: Once | OPHTHALMIC | Status: DC

## 2023-03-29 MED ORDER — TETANUS-DIPHTH-ACELL PERTUSSIS 5-2.5-18.5 LF-MCG/0.5 IM SUSY
0.5000 mL | PREFILLED_SYRINGE | Freq: Once | INTRAMUSCULAR | Status: AC
  Administered 2023-03-29: 0.5 mL via INTRAMUSCULAR
  Filled 2023-03-29: qty 0.5

## 2023-03-29 MED ORDER — HYDROCODONE-ACETAMINOPHEN 5-325 MG PO TABS: 1.0000 | ORAL_TABLET | ORAL | 0 refills | Status: AC | PRN
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
## 2023-12-20 ENCOUNTER — Emergency Department (HOSPITAL_BASED_OUTPATIENT_CLINIC_OR_DEPARTMENT_OTHER)

## 2023-12-20 ENCOUNTER — Other Ambulatory Visit: Payer: Self-pay

## 2023-12-20 ENCOUNTER — Encounter (HOSPITAL_BASED_OUTPATIENT_CLINIC_OR_DEPARTMENT_OTHER): Payer: Self-pay

## 2023-12-20 ENCOUNTER — Emergency Department (HOSPITAL_BASED_OUTPATIENT_CLINIC_OR_DEPARTMENT_OTHER)
Admission: EM | Admit: 2023-12-20 | Discharge: 2023-12-20 | Disposition: A | Discharge status: Home / Self Care | Attending: Emergency Medicine | Admitting: Emergency Medicine

## 2023-12-20 ENCOUNTER — Other Ambulatory Visit (HOSPITAL_BASED_OUTPATIENT_CLINIC_OR_DEPARTMENT_OTHER): Payer: Self-pay

## 2023-12-20 DIAGNOSIS — R059 Cough, unspecified: Secondary | ICD-10-CM | POA: Diagnosis present

## 2023-12-20 DIAGNOSIS — R0602 Shortness of breath: Secondary | ICD-10-CM | POA: Diagnosis not present

## 2023-12-20 DIAGNOSIS — J101 Influenza due to other identified influenza virus with other respiratory manifestations: Secondary | ICD-10-CM | POA: Diagnosis not present

## 2023-12-20 DIAGNOSIS — E876 Hypokalemia: Secondary | ICD-10-CM | POA: Insufficient documentation

## 2023-12-20 DIAGNOSIS — J111 Influenza due to unidentified influenza virus with other respiratory manifestations: Secondary | ICD-10-CM

## 2023-12-20 DIAGNOSIS — R062 Wheezing: Secondary | ICD-10-CM | POA: Diagnosis not present

## 2023-12-20 LAB — BASIC METABOLIC PANEL
Anion gap: 14 (ref 5–15)
BUN: 9 mg/dL (ref 6–20)
CO2: 21 mmol/L — ABNORMAL LOW (ref 22–32)
Calcium: 8.8 mg/dL — ABNORMAL LOW (ref 8.9–10.3)
Chloride: 103 mmol/L (ref 98–111)
Creatinine, Ser: 0.7 mg/dL (ref 0.44–1.00)
GFR, Estimated: >60 mL/min (ref >60)
Glucose, Bld: 112 mg/dL — ABNORMAL HIGH (ref 70–99)
Potassium: 3.2 mmol/L — ABNORMAL LOW (ref 3.5–5.1)
Sodium: 138 mmol/L (ref 135–145)

## 2023-12-20 LAB — CBC
HCT: 46.6 % — ABNORMAL HIGH (ref 36.0–46.0)
Hemoglobin: 15.5 g/dL — ABNORMAL HIGH (ref 12.0–15.0)
MCH: 30 pg (ref 26.0–34.0)
MCHC: 33.3 g/dL (ref 30.0–36.0)
MCV: 90.1 fL (ref 80.0–100.0)
Platelets: 272 10*3/uL (ref 150–400)
RBC: 5.17 MIL/uL — ABNORMAL HIGH (ref 3.87–5.11)
RDW: 13.3 % (ref 11.5–15.5)
WBC: 7.1 10*3/uL (ref 4.0–10.5)
nRBC: 0 % (ref 0.0–0.2)

## 2023-12-20 LAB — RESP PANEL BY RT-PCR (RSV, FLU A&B, COVID)  RVPGX2
Influenza A by PCR: POSITIVE — AB
Influenza B by PCR: NEGATIVE
Resp Syncytial Virus by PCR: NEGATIVE
SARS Coronavirus 2 by RT PCR: NEGATIVE

## 2023-12-20 LAB — HCG, SERUM, QUALITATIVE: Preg, Serum: NEGATIVE

## 2023-12-20 MED ORDER — POTASSIUM CHLORIDE CRYS ER 20 MEQ PO TBCR
40.0000 meq | EXTENDED_RELEASE_TABLET | Freq: Once | ORAL | Status: AC
  Administered 2023-12-20: 40 meq via ORAL
  Filled 2023-12-20: qty 2

## 2023-12-20 MED ORDER — PREDNISONE 10 MG PO TABS
40.0000 mg | ORAL_TABLET | Freq: Every day | ORAL | 0 refills | Status: AC
  Filled 2023-12-20: qty 20, 5d supply, fill #0

## 2023-12-20 MED ORDER — ALBUTEROL SULFATE HFA 108 (90 BASE) MCG/ACT IN AERS
1.0000 | INHALATION_SPRAY | Freq: Four times a day (QID) | RESPIRATORY_TRACT | 1 refills | Status: AC | PRN
  Filled 2023-12-20: qty 6.7, 25d supply, fill #0

## 2023-12-20 MED ORDER — METHYLPREDNISOLONE SODIUM SUCC 125 MG IJ SOLR
125.0000 mg | Freq: Once | INTRAMUSCULAR | Status: AC
  Administered 2023-12-20: 125 mg via INTRAVENOUS
  Filled 2023-12-20: qty 2

## 2023-12-20 MED ORDER — IPRATROPIUM-ALBUTEROL 0.5-2.5 (3) MG/3ML IN SOLN
3.0000 mL | Freq: Once | RESPIRATORY_TRACT | Status: AC
  Administered 2023-12-20: 3 mL via RESPIRATORY_TRACT
  Filled 2023-12-20: qty 3

## 2023-12-20 MED ORDER — ACETAMINOPHEN 500 MG PO TABS
1000.0000 mg | ORAL_TABLET | Freq: Once | ORAL | Status: AC
  Administered 2023-12-20: 1000 mg via ORAL
  Filled 2023-12-20: qty 2
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
## 2023-12-20 NOTE — Discharge Instructions (Addendum)
 Evaluation today was overall reassuring.  It did reveal you have the flu which is likely causing your symptoms at home.  Treatment is supportive at home which includes rest and assertive hydration and you can also take ibuprofen and Tylenol for symptomatic relief and fever control.  Also sent a 5-day course of prednisone and an albuterol inhaler to your pharmacy as you may have also have had a mild asthma attack associated with the flu.  Please follow-up with your PCP as you may need further evaluation for asthma.  If you develop worsening shortness of breath, chest pain, any other concerning symptom please return emerged department further evaluation.

## 2023-12-20 NOTE — ED Triage Notes (Signed)
 Pt states that she has been coughing x 1 week and that she was having some SOB. States that her kids are also sick. States that she was using her mom nebulizer treatments. States that she is having  hard time getting her breath. Took an albuterol prior to arrival. Respiratory in triage.

## 2023-12-20 NOTE — ED Provider Notes (Signed)
 [facility] Provider Note   CSN: [id number] Arrival date & time: 12/20/23  5284     History  Chief Complaint  Patient presents with   Shortness of Breath   HPI Christine Lambert is a 28 y.o. female presenting for shortness of breath.  Started about a week ago.  States she had a cough and nasal congestion and her kids also had the same symptoms at that time.  States in the last couple days his shortness of breath and cough has been somewhat worse.  She endorses some chest discomfort that is intermittent and only occurs with coughing.  She also endorses a fever.  States before she came she used her mother's albuterol nebulizer which seemed to help somewhat.   Shortness of Breath      Home Medications Prior to Admission medications   Medication Sig Start Date End Date Taking? Authorizing Provider  albuterol (VENTOLIN HFA) 108 (90 Base) MCG/ACT inhaler Inhale 1-2 puffs into the lungs every 6 (six) hours as needed for wheezing or shortness of breath. 12/20/23  Yes Gareth Eagle, PA-C  predniSONE (DELTASONE) 10 MG tablet Take 4 tablets (40 mg total) by mouth daily for 5 days. 12/20/23 12/25/23 Yes Gareth Eagle, PA-C  Blood Pressure Monitoring (BLOOD PRESSURE KIT) DEVI 1 Device by Does not apply route daily. ICD 10: Z34.00 06/29/19   Willodean Rosenthal, MD  diphenhydrAMINE (BENADRYL) 25 MG tablet Take 25 mg by mouth at bedtime as needed for allergies.    [provider]  guaiFENesin-codeine 100-10 MG/5ML syrup Take 5 mLs by mouth 2 (two) times daily as needed for cough. 09/01/22   Sloan Leiter, DO  HYDROcodone-acetaminophen (NORCO/VICODIN) 5-325 MG tablet Take 1 tablet by mouth every 4 (four) hours as needed. 03/29/23   Jacalyn Lefevre, MD  ibuprofen (ADVIL) 600 MG tablet Take 1 tablet (600 mg total) by mouth every 6 (six) hours as needed. 03/29/23   Jacalyn Lefevre, MD  Prenatal Vit-Fe Fumarate-FA (MULTIVITAMIN-PRENATAL)  27-0.8 MG TABS tablet Take 1 tablet by mouth daily at 12 noon.    [provider]      Allergies    Nickel    Review of Systems   Review of Systems  Respiratory:  Positive for shortness of breath.     Physical Exam Updated Vital Signs BP 108/76   Pulse 98   Temp 98.6 F (37 C) (Oral)   Resp (!) 22   Ht 5\' 1"  (1.549 m)   Wt 59 kg   SpO2 96%   BMI 24.56 kg/m  Physical Exam Vitals and nursing note reviewed.  HENT:     Head: Normocephalic and atraumatic.     Mouth/Throat:     Mouth: Mucous membranes are moist.  Eyes:     General:        Right eye: No discharge.        Left eye: No discharge.     Conjunctiva/sclera: Conjunctivae normal.  Cardiovascular:     Rate and Rhythm: Regular rhythm. Tachycardia present.     Pulses: Normal pulses.     Heart sounds: Normal heart sounds.  Pulmonary:     Effort: Pulmonary effort is normal.     Breath sounds: Examination of the right-upper field reveals wheezing. Examination of the left-upper field reveals wheezing. Examination of the right-middle field reveals wheezing. Examination of the left-middle field reveals wheezing. Wheezing present. No decreased breath sounds, rhonchi or rales.  Abdominal:  General: Abdomen is flat.     Palpations: Abdomen is soft.  Skin:    General: Skin is warm and dry.  Neurological:     General: No focal deficit present.  Psychiatric:        Mood and Affect: Mood normal.     ED Results / Procedures / Treatments   Labs (all labs ordered are listed, but only abnormal results are displayed) Labs Reviewed  RESP PANEL BY RT-PCR (RSV, FLU A&B, COVID)  RVPGX2 - Abnormal; Notable for the following components:      Result Value   Influenza A by PCR POSITIVE (*)    All other components within normal limits  BASIC METABOLIC PANEL - Abnormal; Notable for the following components:   Potassium 3.2 (*)    CO2 21 (*)    Glucose, Bld 112 (*)    Calcium 8.8 (*)    All other components within  normal limits  CBC - Abnormal; Notable for the following components:   RBC 5.17 (*)    Hemoglobin 15.5 (*)    HCT 46.6 (*)    All other components within normal limits  HCG, SERUM, QUALITATIVE    EKG None  Radiology DG Chest 2 View Result Date: 12/20/2023 CLINICAL DATA:  Shortness of breath EXAM: CHEST - 2 VIEW COMPARISON:  09/01/2022 FINDINGS: The heart size and mediastinal contours are within normal limits. Both lungs are clear. The visualized skeletal structures are unremarkable. IMPRESSION: No active cardiopulmonary disease. Electronically Signed   By: Duanne Guess D.O.   On: 12/20/2023 13:06    Procedures Procedures    Medications Ordered in ED Medications  potassium chloride SA (KLOR-CON M) CR tablet 40 mEq (has no administration in time range)  ipratropium-albuterol (DUONEB) 0.5-2.5 (3) MG/3ML nebulizer solution 3 mL (3 mLs Nebulization Given 12/20/23 1048)  methylPREDNISolone sodium succinate (SOLU-MEDROL) 125 mg/2 mL injection 125 mg (125 mg Intravenous Given 12/20/23 1114)  acetaminophen (TYLENOL) tablet 1,000 mg (1,000 mg Oral Given 12/20/23 1127)    ED Course/ Medical Decision Making/ A&P                                 Medical Decision Making Amount and/or Complexity of Data Reviewed Labs: ordered. Radiology: ordered.  Risk OTC drugs. Prescription drug management.   Initial Impression and Ddx 28 year old well-appearing female presenting for shortness of breath and URI symptoms.  Exam notable for diffuse wheezing, tachycardia, febrile.  DDx includes asthma exacerbation, PE, CHF exacerbation, ACS, pneumonia, sepsis, viral URI, other. Patient PMH that increases complexity of ED encounter:  none  Interpretation of Diagnostics I independent reviewed and interpreted the labs as followed: flu A positive, mild hypokalemia  - I independently visualized the following imaging with scope of interpretation limited to determining acute life threatening conditions  related to emergency care: CXR, which revealed no active process  -I personally reviewed and interpreted EKG which revealed sinus tachycardia  Patient Reassessment and Ultimate Disposition/Management On reassessment after Tylenol and p.o. hydration.  Tachycardia resolved and patient defervesced.  Suspect her fever is likely related to the fact that she has the flu.  She may have also had a mild associated asthma exacerbation.  Tachycardia explained by the fever and use of albuterol.  She does not appear septic.  Advised supportive treatment at home for the flu.  Sent a 5-day course of prednisone and an albuterol inhaler to her pharmacy.  Advised her to follow-up  with her PCP for reevaluation.  Discussed return precautions.  Discharged in good condition.  Patient management required discussion with the following services or consulting groups:  None  Complexity of Problems Addressed Acute complicated illness or Injury  Additional Data Reviewed and Analyzed Further history obtained from: Past medical history and medications listed in the EMR  Patient Encounter Risk Assessment Prescriptions         Final Clinical Impression(s) / ED Diagnoses Final diagnoses:  Flu  SOB (shortness of breath)    Rx / DC Orders ED Discharge Orders          Ordered    predniSONE (DELTASONE) 10 MG tablet  Daily        12/20/23 1315    albuterol (VENTOLIN HFA) 108 (90 Base) MCG/ACT inhaler  Every 6 hours PRN        12/20/23 1315              Gareth Eagle, PA-C 12/20/23 1316    Melene Plan, DO 12/20/23 1318

## 2024-03-07 ENCOUNTER — Telehealth: Payer: Self-pay | Admitting: Physician Assistant

## 2024-03-07 DIAGNOSIS — B9689 Other specified bacterial agents as the cause of diseases classified elsewhere: Secondary | ICD-10-CM

## 2024-03-07 DIAGNOSIS — N760 Acute vaginitis: Secondary | ICD-10-CM

## 2024-03-07 MED ORDER — METRONIDAZOLE 500 MG PO TABS: 500.0000 mg | ORAL_TABLET | Freq: Two times a day (BID) | ORAL | 0 refills | Status: AC

## 2024-03-07 NOTE — Patient Instructions (Signed)
  Alben Alma, thank you for joining Wanita Gutta, PA-C for today's virtual visit.  While this provider is not your primary care provider (PCP), if your PCP is located in our provider database this encounter information will be shared with them immediately following your visit.   A [facility] MyChart account gives you access to today's visit and all your visits, tests, and labs performed at [facility] " click here if you don't have a [facility] MyChart account or go to [email or web address]  Consent: (Patient) Leily Capek provided verbal consent for this virtual visit at the beginning of the encounter.  Current Medications:  Current Outpatient Medications:    metroNIDAZOLE  (FLAGYL ) 500 MG tablet, Take 1 tablet (500 mg total) by mouth 2 (two) times daily for 7 days., Disp: 14 tablet, Rfl: 0   albuterol  (VENTOLIN  HFA) 108 (90 Base) MCG/ACT inhaler, Inhale 1-2 puffs into the lungs every 6 (six) hours as needed for wheezing or shortness of breath., Disp: 6.7 g, Rfl: 1   Blood Pressure Monitoring (BLOOD PRESSURE KIT) DEVI, 1 Device by Does not apply route daily. ICD 10: Z34.00, Disp: 1 Device, Rfl: 0   diphenhydrAMINE  (BENADRYL ) 25 MG tablet, Take 25 mg by mouth at bedtime as needed for allergies., Disp: , Rfl:    HYDROcodone -acetaminophen  (NORCO/VICODIN) 5-325 MG tablet, Take 1 tablet by mouth every 4 (four) hours as needed., Disp: 10 tablet, Rfl: 0   ibuprofen  (ADVIL ) 600 MG tablet, Take 1 tablet (600 mg total) by mouth every 6 (six) hours as needed., Disp: 30 tablet, Rfl: 0   Prenatal Vit-Fe Fumarate-FA (MULTIVITAMIN-PRENATAL) 27-0.8 MG TABS tablet, Take 1 tablet by mouth daily at 12 noon., Disp: , Rfl:    Medications ordered in this encounter:  Meds ordered this encounter  Medications   metroNIDAZOLE  (FLAGYL ) 500 MG tablet    Sig: Take 1 tablet (500 mg total) by mouth 2 (two) times daily for 7 days.    Dispense:  14 tablet    Refill:  0    Supervising  Provider:   Corine Dice [[id number]]     *If you need refills on other medications prior to your next appointment, please contact your pharmacy*  Follow-Up: Call back or seek an in-person evaluation if the symptoms worsen or if the condition fails to improve as anticipated.  [facility] [phone]  Other Instructions Start medicine as prescribed.  Eat yogurt for gut bacteria.  Continue to watch for worsening symptoms. If symptoms don't improve or worsen then go to [facility] clinic for further evaluation and management.    If you have been instructed to have an in-person evaluation today at a local Urgent Care facility, please use the link below. It will take you to a list of all of our available [facility], including address, phone number and hours of operation. Please do not delay care.  [place] [facility]  If you or a family member do not have a primary care provider, use the link below to schedule a visit and establish care. When you choose a [facility] primary care physician or advanced practice provider, you gain a long-term partner in health. Find a Primary Care Provider  Learn more about [facility]'s in-office and virtual care options:  - Get Care Now

## 2024-03-07 NOTE — Progress Notes (Signed)
 Virtual Visit Consent   Christine Lambert, you are scheduled for a virtual visit with a [facility] provider today. Just as with appointments in the office, your consent must be obtained to participate. Your consent will be active for this visit and any virtual visit you may have with one of our providers in the next 365 days. If you have a MyChart account, a copy of this consent can be sent to you electronically.  As this is a virtual visit, video technology does not allow for your provider to perform a traditional examination. This may limit your provider's ability to fully assess your condition. If your provider identifies any concerns that need to be evaluated in person or the need to arrange testing (such as labs, EKG, etc.), we will make arrangements to do so. Although advances in technology are sophisticated, we cannot ensure that it will always work on either your end or our end. If the connection with a video visit is poor, the visit may have to be switched to a telephone visit. With either a video or telephone visit, we are not always able to ensure that we have a secure connection.  By engaging in this virtual visit, you consent to the provision of healthcare and authorize for your insurance to be billed (if applicable) for the services provided during this visit. Depending on your insurance coverage, you may receive a charge related to this service.  I need to obtain your verbal consent now. Are you willing to proceed with your visit today? Christine Lambert has provided verbal consent on 03/07/2024 for a virtual visit (video or telephone). Christine Lambert, [place]  Date: 03/07/2024 5:26 PM   Virtual Visit via Video Note   I, Christine Lambert, connected with  Christine Lambert  ([id number], 08-20-1996) on 03/07/24 at  5:15 PM EDT by a video-enabled telemedicine application and verified that I am speaking with the correct person using two identifiers.  Location: Patient: Virtual Visit Location  Patient: Home Provider: Virtual Visit Location Provider: Home Office   I discussed the limitations of evaluation and management by telemedicine and the availability of in person appointments. The patient expressed understanding and agreed to proceed.    History of Present Illness: Christine Lambert is a 28 y.o. who identifies as a female who was assigned female at birth, and is being seen today for vaginal d/c.  HPI: 27y/o F presents to video telehealth visit for c/o vaginal itching, discharge, malodorous discharge x 1 week. Denies dysuria. Denies fever or back pain. Partner tested positive for BV. Pt with similar symptoms previously and states she was treated successfully with antibiotic. No h/o antibiotic induced yeast infection.     Vaginal Discharge The patient's primary symptoms include vaginal discharge.    Problems:  Patient Active Problem List   Diagnosis Date Noted   SVD (spontaneous vaginal delivery) 08/01/2019   Nexplanon  insertion 08/01/2019   Post term pregnancy 07/31/2019   Anemia 05/06/2019   Urinary tract infection in mother during third trimester of pregnancy 05/03/2019   Supervision of other normal pregnancy, antepartum 04/05/2019   Arthritis 03/01/2018   History of migraine headaches 03/01/2018    Allergies:  Allergies  Allergen Reactions   Nickel Rash   Medications:  Current Outpatient Medications:    metroNIDAZOLE  (FLAGYL ) 500 MG tablet, Take 1 tablet (500 mg total) by mouth 2 (two) times daily for 7 days., Disp: 14 tablet, Rfl: 0   albuterol  (VENTOLIN  HFA) 108 (90 Base) MCG/ACT inhaler, Inhale 1-2 puffs into the  lungs every 6 (six) hours as needed for wheezing or shortness of breath., Disp: 6.7 g, Rfl: 1   Blood Pressure Monitoring (BLOOD PRESSURE KIT) DEVI, 1 Device by Does not apply route daily. ICD 10: Z34.00, Disp: 1 Device, Rfl: 0   diphenhydrAMINE  (BENADRYL ) 25 MG tablet, Take 25 mg by mouth at bedtime as needed for allergies., Disp: , Rfl:     HYDROcodone -acetaminophen  (NORCO/VICODIN) 5-325 MG tablet, Take 1 tablet by mouth every 4 (four) hours as needed., Disp: 10 tablet, Rfl: 0   ibuprofen  (ADVIL ) 600 MG tablet, Take 1 tablet (600 mg total) by mouth every 6 (six) hours as needed., Disp: 30 tablet, Rfl: 0   Prenatal Vit-Fe Fumarate-FA (MULTIVITAMIN-PRENATAL) 27-0.8 MG TABS tablet, Take 1 tablet by mouth daily at 12 noon., Disp: , Rfl:   Observations/Objective: Patient is well-developed, well-nourished in no acute distress.  Resting comfortably  at home.  Head is normocephalic, atraumatic.  No labored breathing.  Speech is clear and coherent with logical content.  Patient is alert and oriented at baseline.    Assessment and Plan: 1. Bacterial vaginosis (Primary) - metroNIDAZOLE  (FLAGYL ) 500 MG tablet; Take 1 tablet (500 mg total) by mouth 2 (two) times daily for 7 days.  Dispense: 14 tablet; Refill: 0  Start medicine as prescribed.  Eat yogurt for gut bacteria.  Continue to watch for worsening symptoms. If symptoms don't improve or worsen then go to [facility] clinic for further evaluation and management.  Pt verbalized understanding and in agreement.    Follow Up Instructions: I discussed the assessment and treatment plan with the patient. The patient was provided an opportunity to ask questions and all were answered. The patient agreed with the plan and demonstrated an understanding of the instructions.  A copy of instructions were sent to the patient via MyChart unless otherwise noted below.   Patient has requested to receive PHI (AVS, Work Notes, etc) pertaining to this video visit through e-mail as they are currently without active MyChart. They have voiced understand that email is not considered secure and their health information could be viewed by someone other than the patient.   The patient was advised to call back or seek an in-person evaluation if the symptoms worsen or if the condition fails to improve as anticipated.     Christine Westberg, PA-C

## 2024-05-09 ENCOUNTER — Telehealth: Admitting: Physician Assistant

## 2024-05-09 DIAGNOSIS — T3695XA Adverse effect of unspecified systemic antibiotic, initial encounter: Secondary | ICD-10-CM | POA: Diagnosis not present

## 2024-05-09 DIAGNOSIS — B379 Candidiasis, unspecified: Secondary | ICD-10-CM | POA: Diagnosis not present

## 2024-05-09 MED ORDER — FLUCONAZOLE 150 MG PO TABS: 150.0000 mg | ORAL_TABLET | ORAL | 0 refills | Status: DC | PRN

## 2024-05-09 NOTE — Patient Instructions (Signed)
 Christine Lambert, thank you for joining Christine CHRISTELLA Dickinson, PA-C for today's virtual visit.  While this provider is not your primary care provider (PCP), if your PCP is located in our provider database this encounter information will be shared with them immediately following your visit.   A [facility] MyChart account gives you access to today's visit and all your visits, tests, and labs performed at [facility]  click here if you don't have a [facility] MyChart account or go to [email or web address]  Consent: (Patient) Christine Lambert provided verbal consent for this virtual visit at the beginning of the encounter.  Current Medications:  Current Outpatient Medications:    fluconazole  (DIFLUCAN ) 150 MG tablet, Take 1 tablet (150 mg total) by mouth every 3 (three) days as needed., Disp: 2 tablet, Rfl: 0   albuterol  (VENTOLIN  HFA) 108 (90 Base) MCG/ACT inhaler, Inhale 1-2 puffs into the lungs every 6 (six) hours as needed for wheezing or shortness of breath., Disp: 6.7 g, Rfl: 1   Blood Pressure Monitoring (BLOOD PRESSURE KIT) DEVI, 1 Device by Does not apply route daily. ICD 10: Z34.00, Disp: 1 Device, Rfl: 0   diphenhydrAMINE  (BENADRYL ) 25 MG tablet, Take 25 mg by mouth at bedtime as needed for allergies., Disp: , Rfl:    HYDROcodone -acetaminophen  (NORCO/VICODIN) 5-325 MG tablet, Take 1 tablet by mouth every 4 (four) hours as needed., Disp: 10 tablet, Rfl: 0   ibuprofen  (ADVIL ) 600 MG tablet, Take 1 tablet (600 mg total) by mouth every 6 (six) hours as needed., Disp: 30 tablet, Rfl: 0   Prenatal Vit-Fe Fumarate-FA (MULTIVITAMIN-PRENATAL) 27-0.8 MG TABS tablet, Take 1 tablet by mouth daily at 12 noon., Disp: , Rfl:    Medications ordered in this encounter:  Meds ordered this encounter  Medications   fluconazole  (DIFLUCAN ) 150 MG tablet    Sig: Take 1 tablet (150 mg total) by mouth every 3 (three) days as needed.    Dispense:  2 tablet    Refill:  0    Supervising  Provider:   LAMPTEY, PHILIP O [[id number]]     *If you need refills on other medications prior to your next appointment, please contact your pharmacy*  Follow-Up: Call back or seek an in-person evaluation if the symptoms worsen or if the condition fails to improve as anticipated.  [facility] [phone]  Other Instructions Vaginal Probiotics: AZO vaginal probiotic OLLY Happy Hoo-Ha RAW Vaginal Care RenewLife Women's vaginal probiotic RepHresh Pro-B  Vaginal washes: Honey Pot Summer's Eve Vagisil Feminine cleanser  Boric Acid Suppositories  Healthy vaginal hygiene practices    -  Avoid sleeper pajamas. Nightgowns allow air to circulate.  Sleep without underpants whenever possible.   -  Wear cotton underpants during the day. Double-rinse underwear after washing to avoid residual irritants. Do not use fabric softeners for underwear and swimsuits.   - Avoid tights, leotards, leggings, skinny jeans, and other tight-fitting clothing. Skirts and loose-fitting pants allow air to circulate.   - Avoid pantyliners.  Instead use tampons or cotton pads.   - Use the restroom after intercourse to help prevent UTI's   - Daily warm bathing is helpful:     - Soak in clean water (no soap) for 10 to 15 minutes. Adding vinegar or baking soda to the water has not been specifically studied and may not be better than clean water alone.      - Use soap to wash regions other than the genital area just before getting out of  the tub. Limit use of any soap on genital areas. Use fragance-free soaps.     - Rinse the genital area well and gently pat dry.  Don't rub.  Hair dryer to assist with drying can be used only if on cool setting.     - Do not use bubble baths or perfumed soaps.   - Do not use any feminine sprays, douches or powders.  These contain chemicals that will irritate the skin.   - If the genital area is tender or swollen, cool compresses may relieve the discomfort.  Unscented wet wipes can be used instead of toilet paper for wiping.    - Emollients, such as Vaseline, may help protect skin and can be applied to the irritated area.   - Always remember to wipe front-to-back after bowel movements. Pat dry after urination.   - Do not sit in wet swimsuits for long periods of time after swimming    If you have been instructed to have an in-person evaluation today at a local Urgent Care facility, please use the link below. It will take you to a list of all of our available [facility], including address, phone number and hours of operation. Please do not delay care.  [facility]  If you or a family member do not have a primary care provider, use the link below to schedule a visit and establish care. When you choose a [facility] primary care physician or advanced practice provider, you gain a long-term partner in health. Find a Primary Care Provider  Learn more about [facility]'s in-office and virtual care options: [place] [facility] - Get Care Now

## 2024-05-09 NOTE — Progress Notes (Signed)
 Virtual Visit Consent   Christine Lambert, you are scheduled for a virtual visit with a [facility] provider today. Just as with appointments in the office, your consent must be obtained to participate. Your consent will be active for this visit and any virtual visit you may have with one of our providers in the next 365 days. If you have a MyChart account, a copy of this consent can be sent to you electronically.  As this is a virtual visit, video technology does not allow for your provider to perform a traditional examination. This may limit your provider's ability to fully assess your condition. If your provider identifies any concerns that need to be evaluated in person or the need to arrange testing (such as labs, EKG, etc.), we will make arrangements to do so. Although advances in technology are sophisticated, we cannot ensure that it will always work on either your end or our end. If the connection with a video visit is poor, the visit may have to be switched to a telephone visit. With either a video or telephone visit, we are not always able to ensure that we have a secure connection.  By engaging in this virtual visit, you consent to the provision of healthcare and authorize for your insurance to be billed (if applicable) for the services provided during this visit. Depending on your insurance coverage, you may receive a charge related to this service.  I need to obtain your verbal consent now. Are you willing to proceed with your visit today? Orpah Hausner has provided verbal consent on 05/09/2024 for a virtual visit (video or telephone). Delon CHRISTELLA Dickinson, PA-C  Date: 05/09/2024 7:49 AM   Virtual Visit via Video Note   I, Delon CHRISTELLA Dickinson, connected with  Christine Lambert  ([id number], July 17, 1996) on 05/09/24 at  7:45 AM EDT by a video-enabled telemedicine application and verified that I am speaking with the correct person using two identifiers.  Location: Patient: Virtual  Visit Location Patient: Home Provider: Virtual Visit Location Provider: Home Office   I discussed the limitations of evaluation and management by telemedicine and the availability of in person appointments. The patient expressed understanding and agreed to proceed.    History of Present Illness: Christine Lambert is a 28 y.o. who identifies as a female who was assigned female at birth, and is being seen today for vaginal irritation.  HPI: Vaginal Itching The patient's primary symptoms include genital itching and vaginal discharge. The patient's pertinent negatives include no genital odor. This is a new problem. The current episode started 1 to 4 weeks ago (Had been on antibiotic for BV in June 2025). The problem occurs constantly. The problem has been unchanged. The patient is experiencing no pain. The vaginal discharge was thick and white. There has been no bleeding. She has not been passing clots. She has not been passing tissue. The symptoms are aggravated by tactile pressure. She has tried antifungals (monistat 1 day suppositories) for the symptoms. The treatment provided no relief.     Problems:  Patient Active Problem List   Diagnosis Date Noted   SVD (spontaneous vaginal delivery) 08/01/2019   Nexplanon  insertion 08/01/2019   Post term pregnancy 07/31/2019   Anemia 05/06/2019   Urinary tract infection in mother during third trimester of pregnancy 05/03/2019   Supervision of other normal pregnancy, antepartum 04/05/2019   Arthritis 03/01/2018   History of migraine headaches 03/01/2018    Allergies:  Allergies  Allergen Reactions   Nickel Rash   Medications:  Current Outpatient Medications:    fluconazole  (DIFLUCAN ) 150 MG tablet, Take 1 tablet (150 mg total) by mouth every 3 (three) days as needed., Disp: 2 tablet, Rfl: 0   albuterol  (VENTOLIN  HFA) 108 (90 Base) MCG/ACT inhaler, Inhale 1-2 puffs into the lungs every 6 (six) hours as needed for wheezing or shortness of breath.,  Disp: 6.7 g, Rfl: 1   Blood Pressure Monitoring (BLOOD PRESSURE KIT) DEVI, 1 Device by Does not apply route daily. ICD 10: Z34.00, Disp: 1 Device, Rfl: 0   diphenhydrAMINE  (BENADRYL ) 25 MG tablet, Take 25 mg by mouth at bedtime as needed for allergies., Disp: , Rfl:    HYDROcodone -acetaminophen  (NORCO/VICODIN) 5-325 MG tablet, Take 1 tablet by mouth every 4 (four) hours as needed., Disp: 10 tablet, Rfl: 0   ibuprofen  (ADVIL ) 600 MG tablet, Take 1 tablet (600 mg total) by mouth every 6 (six) hours as needed., Disp: 30 tablet, Rfl: 0   Prenatal Vit-Fe Fumarate-FA (MULTIVITAMIN-PRENATAL) 27-0.8 MG TABS tablet, Take 1 tablet by mouth daily at 12 noon., Disp: , Rfl:   Observations/Objective: Patient is well-developed, well-nourished in no acute distress.  Resting comfortably at home.  Head is normocephalic, atraumatic.  No labored breathing.  Speech is clear and coherent with logical content.  Patient is alert and oriented at baseline.    Assessment and Plan: 1. Antibiotic-induced yeast infection (Primary) - fluconazole  (DIFLUCAN ) 150 MG tablet; Take 1 tablet (150 mg total) by mouth every 3 (three) days as needed.  Dispense: 2 tablet; Refill: 0  - Symptoms consistent with yeast vaginitis secondary to recent antibiotics - Fluconazole  prescribed - Limit bubble baths, scented lotions/soaps/detergents - Limit tight fitting clothing - Seek on person evaluation if not improving or if symptoms worsen   Follow Up Instructions: I discussed the assessment and treatment plan with the patient. The patient was provided an opportunity to ask questions and all were answered. The patient agreed with the plan and demonstrated an understanding of the instructions.  A copy of instructions were sent to the patient via MyChart unless otherwise noted below.    The patient was advised to call back or seek an in-person evaluation if the symptoms worsen or if the condition fails to improve as anticipated.     Delon CHRISTELLA Dickinson, PA-C

## 2024-06-14 ENCOUNTER — Telehealth: Admitting: Physician Assistant

## 2024-06-14 DIAGNOSIS — N760 Acute vaginitis: Secondary | ICD-10-CM

## 2024-06-14 MED ORDER — METRONIDAZOLE 500 MG PO TABS: 500.0000 mg | ORAL_TABLET | Freq: Two times a day (BID) | ORAL | 0 refills | Status: AC

## 2024-06-14 MED ORDER — FLUCONAZOLE 150 MG PO TABS: 150.0000 mg | ORAL_TABLET | Freq: Once | ORAL | 0 refills | Status: AC

## 2024-06-14 NOTE — Progress Notes (Signed)
 Virtual Visit Consent   Druscilla Petsch, you are scheduled for a virtual visit with a [facility] provider today. Just as with appointments in the office, your consent must be obtained to participate. Your consent will be active for this visit and any virtual visit you may have with one of our providers in the next 365 days. If you have a MyChart account, a copy of this consent can be sent to you electronically.  As this is a virtual visit, video technology does not allow for your provider to perform a traditional examination. This may limit your provider's ability to fully assess your condition. If your provider identifies any concerns that need to be evaluated in person or the need to arrange testing (such as labs, EKG, etc.), we will make arrangements to do so. Although advances in technology are sophisticated, we cannot ensure that it will always work on either your end or our end. If the connection with a video visit is poor, the visit may have to be switched to a telephone visit. With either a video or telephone visit, we are not always able to ensure that we have a secure connection.  By engaging in this virtual visit, you consent to the provision of healthcare and authorize for your insurance to be billed (if applicable) for the services provided during this visit. Depending on your insurance coverage, you may receive a charge related to this service.  I need to obtain your verbal consent now. Are you willing to proceed with your visit today? Merriam Brandner has provided verbal consent on 06/14/2024 for a virtual visit (video or telephone). Lekeisha Arenas, PA-C  Date: 06/14/2024 11:01 AM   Virtual Visit via Video Note   I, Christine Lambert, connected with  Nomi Rudnicki  ([id number], 07-30-1996) on 06/14/24 at 10:45 AM EDT by a video-enabled telemedicine application and verified that I am speaking with the correct person using two identifiers.  Location: Patient: Virtual Visit  Location Patient: Home Provider: Virtual Visit Location Provider: Home Office   I discussed the limitations of evaluation and management by telemedicine and the availability of in person appointments. The patient expressed understanding and agreed to proceed.    History of Present Illness: Christine Lambert is a 28 y.o. who identifies as a female who was assigned female at birth, and is being seen today for vaginal d/c.  HPI: Vaginal Discharge The patient's primary symptoms include vaginal discharge. This is a new problem. The current episode started yesterday. The problem occurs constantly. The problem has been gradually worsening. Pertinent negatives include no abdominal pain, back pain, chills, diarrhea, dysuria, fever, flank pain, frequency, joint pain, nausea or urgency. The vaginal discharge was white. There has been no bleeding. She is sexually active. No, her partner does not have an STD. She uses nothing for contraception. Her menstrual history has been regular.    Problems:  Patient Active Problem List   Diagnosis Date Noted   SVD (spontaneous vaginal delivery) 08/01/2019   Nexplanon  insertion 08/01/2019   Post term pregnancy 07/31/2019   Anemia 05/06/2019   Urinary tract infection in mother during third trimester of pregnancy 05/03/2019   Supervision of other normal pregnancy, antepartum 04/05/2019   Arthritis 03/01/2018   History of migraine headaches 03/01/2018    Allergies:  Allergies  Allergen Reactions   Nickel Rash   Medications:  Current Outpatient Medications:    fluconazole  (DIFLUCAN ) 150 MG tablet, Take 1 tablet (150 mg total) by mouth once for 1 dose., Disp: 1 tablet,  Rfl: 0   metroNIDAZOLE  (FLAGYL ) 500 MG tablet, Take 1 tablet (500 mg total) by mouth 2 (two) times daily for 7 days., Disp: 14 tablet, Rfl: 0   albuterol  (VENTOLIN  HFA) 108 (90 Base) MCG/ACT inhaler, Inhale 1-2 puffs into the lungs every 6 (six) hours as needed for wheezing or shortness of  breath., Disp: 6.7 g, Rfl: 1   Blood Pressure Monitoring (BLOOD PRESSURE KIT) DEVI, 1 Device by Does not apply route daily. ICD 10: Z34.00, Disp: 1 Device, Rfl: 0   diphenhydrAMINE  (BENADRYL ) 25 MG tablet, Take 25 mg by mouth at bedtime as needed for allergies., Disp: , Rfl:    HYDROcodone -acetaminophen  (NORCO/VICODIN) 5-325 MG tablet, Take 1 tablet by mouth every 4 (four) hours as needed., Disp: 10 tablet, Rfl: 0   ibuprofen  (ADVIL ) 600 MG tablet, Take 1 tablet (600 mg total) by mouth every 6 (six) hours as needed., Disp: 30 tablet, Rfl: 0   Prenatal Vit-Fe Fumarate-FA (MULTIVITAMIN-PRENATAL) 27-0.8 MG TABS tablet, Take 1 tablet by mouth daily at 12 noon., Disp: , Rfl:   Observations/Objective: Patient is well-developed, well-nourished in no acute distress.  Resting comfortably  at home.  Head is normocephalic, atraumatic.  No labored breathing.  Speech is clear and coherent with logical content.  Patient is alert and oriented at baseline.    Assessment and Plan: 1. Acute vaginitis (Primary) - metroNIDAZOLE  (FLAGYL ) 500 MG tablet; Take 1 tablet (500 mg total) by mouth 2 (two) times daily for 7 days.  Dispense: 14 tablet; Refill: 0 - fluconazole  (DIFLUCAN ) 150 MG tablet; Take 1 tablet (150 mg total) by mouth once for 1 dose.  Dispense: 1 tablet; Refill: 0   Please take medications as directed Fluconazole  after completion of Flagyl  if needed, ( chart review with hx of ABX induced yeast infection.  No sexual activity until symptoms have resolved  Avoid use of products with scents, fragrances  such as detergents, body washes  Follow-up for an in-person evaluation if symptoms persist   Follow Up Instructions: I discussed the assessment and treatment plan with the patient. The patient was provided an opportunity to ask questions and all were answered. The patient agreed with the plan and demonstrated an understanding of the instructions.  A copy of instructions were sent to the patient via  MyChart unless otherwise noted below.    The patient was advised to call back or seek an in-person evaluation if the symptoms worsen or if the condition fails to improve as anticipated.    Molli Gethers, PA-C

## 2024-06-14 NOTE — Patient Instructions (Addendum)
 [name], thank you for joining Treon Kehl, PA-C for today's virtual visit.  While this provider is not your primary care provider (PCP), if your PCP is located in our provider database this encounter information will be shared with them immediately following your visit.   A [facility] MyChart account gives you access to today's visit and all your visits, tests, and labs performed at [facility]  click here if you don't have a [facility] MyChart account or go to [email or web address]  Consent: (Patient) Christine Lambert provided verbal consent for this virtual visit at the beginning of the encounter.  Current Medications:  Current Outpatient Medications:    albuterol  (VENTOLIN  HFA) 108 (90 Base) MCG/ACT inhaler, Inhale 1-2 puffs into the lungs every 6 (six) hours as needed for wheezing or shortness of breath., Disp: 6.7 g, Rfl: 1   Blood Pressure Monitoring (BLOOD PRESSURE KIT) DEVI, 1 Device by Does not apply route daily. ICD 10: Z34.00, Disp: 1 Device, Rfl: 0   diphenhydrAMINE  (BENADRYL ) 25 MG tablet, Take 25 mg by mouth at bedtime as needed for allergies., Disp: , Rfl:    fluconazole  (DIFLUCAN ) 150 MG tablet, Take 1 tablet (150 mg total) by mouth every 3 (three) days as needed., Disp: 2 tablet, Rfl: 0   HYDROcodone -acetaminophen  (NORCO/VICODIN) 5-325 MG tablet, Take 1 tablet by mouth every 4 (four) hours as needed., Disp: 10 tablet, Rfl: 0   ibuprofen  (ADVIL ) 600 MG tablet, Take 1 tablet (600 mg total) by mouth every 6 (six) hours as needed., Disp: 30 tablet, Rfl: 0   Prenatal Vit-Fe Fumarate-FA (MULTIVITAMIN-PRENATAL) 27-0.8 MG TABS tablet, Take 1 tablet by mouth daily at 12 noon., Disp: , Rfl:    Medications ordered in this encounter:  No orders of the defined types were placed in this encounter.    *If you need refills on other medications prior to your next appointment, please contact your pharmacy*  Follow-Up: Call back or seek an in-person  evaluation if the symptoms worsen or if the condition fails to improve as anticipated.  [facility] [phone]  Other Instructions  Please take medications as directed Fluconazole  after completion of Flagyl  if needed, ( chart review with hx of ABX induced yeast infection.  No sexual activity until symptoms have resolved  Avoid use of products with scents, fragrances  such as detergents, body washes  Follow-up for an in-person evaluation if symptoms persist   Vaginal Infection (Bacterial Vaginosis): What to Know  Bacterial vaginosis is an infection of the vagina. It happens when the balance of normal germs (bacteria) in the vagina changes. If you don't get treated, it can make it easier for you to get other infections from sex. These are called sexually transmitted infections (STIs). If you're pregnant, you need to get treated right away. This infection can cause a baby to be born early or at a low birth weight. What are the causes? This infection happens when too many harmful germs grow in the vagina. You can't get this infection from toilet seats, bedsheets, swimming pools, or things that touch your vagina. What increases the risk? Having sex with a new person or more than one person. Having sex without protection. Douching. Having an intrauterine device (IUD). Smoking. Using drugs or drinking alcohol. These can lead you to do risky things. Taking certain antibiotics. Being pregnant. What are the signs or symptoms? Some females have no symptoms. Symptoms may include: A gray or white discharge from your vagina. It can be watery or  foamy. A fishy smell. This can happen after sex or during your menstrual period. Itching in and around your vagina. Burning or pain when you pee. How is this treated? This infection is treated with antibiotics. These may be given to you as: A pill. A cream for your vagina. A medicine that you put into your vagina (suppository). If  the infection comes back, you may need more antibiotics. Follow these instructions at home: Medicines Take your medicines as told. Take or use your antibiotics as told. Do not stop using them even if you start to feel better. General instructions If the person you have sex with is a female, tell her that you have this infection. She will need to follow up with her doctor. Female partners don't need to be treated. Do not have sex until you finish treatment. Drink more fluids as told. Keep your vagina and butt clean. Wash these areas with warm water each day. Wipe from front to back after you poop. If you're breastfeeding a baby, talk to your doctor if you should keep doing so during treatment. How is this prevented? Self-care Do not douche. Do not use deodorant sprays on your vagina. Wear cotton underwear. Do not wear tight pants and pantyhose, especially in the summer. Safe sex Use condoms the correct way and every time you have sex. Use dental dams to protect yourself during oral sex. Limit how many people you have sex with. Get tested for STIs. The person you have sex with should also get tested. Drugs and alcohol Do not smoke, vape, or use nicotine or tobacco. Do not use drugs. Limit the amount of alcohol you drink because it can lead you to do risky things. Where to find more information To learn more: Go to TonerPromos.no. Click Health Topics A-Z. Type bacterial vaginosis in the search bar. American Sexual Health Association (ASHA): ashasexualhealth.org U.S. Department of Health and [employer], Office on Women's Health: TravelLesson.ca Contact a doctor if: Your symptoms don't get better, even after treatment. You have more discharge or pain when you pee. You have a fever or chills. You have pain in your belly or in the area between your hips. You have pain during sex. You bleed from your vagina between menstrual periods. This information is not intended to replace advice  given to you by your health care provider. Make sure you discuss any questions you have with your health care provider. Document Revised: 03/10/2023 Document Reviewed: 03/10/2023 Elsevier Patient Education  2024 Elsevier Inc.   If you have been instructed to have an in-person evaluation today at a local Urgent Care facility, please use the link below. It will take you to a list of all of our available [facility], including address, phone number and hours of operation. Please do not delay care.  [facility]  If you or a family member do not have a primary care provider, use the link below to schedule a visit and establish care. When you choose a [facility] primary care physician or advanced practice provider, you gain a long-term partner in health. Find a Primary Care Provider  Learn more about [facility]'s in-office and virtual care options: [facility] - Get Care Now
